# Patient Record
Sex: Male | Born: 1995 | Race: White | Hispanic: No | Marital: Single | Smoking: Never smoker
Health system: Southern US, Community
[De-identification: ages and names within clinical notes are randomized; demographics above are authoritative.]

## PROBLEM LIST (undated history)

## (undated) HISTORY — PX: WISDOM TOOTH EXTRACTION: SHX21

---
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
## 2014-08-14 ENCOUNTER — Emergency Department (HOSPITAL_COMMUNITY)
Admission: EM | Admit: 2014-08-14 | Discharge: 2014-08-15 | Disposition: A | Discharge status: Home / Self Care | Payer: Managed Care, Other (non HMO) | Attending: Emergency Medicine | Admitting: Emergency Medicine

## 2014-08-14 ENCOUNTER — Encounter (HOSPITAL_COMMUNITY): Payer: Self-pay | Admitting: Emergency Medicine

## 2014-08-14 DIAGNOSIS — R109 Unspecified abdominal pain: Secondary | ICD-10-CM | POA: Diagnosis present

## 2014-08-14 DIAGNOSIS — R10813 Right lower quadrant abdominal tenderness: Secondary | ICD-10-CM | POA: Insufficient documentation

## 2014-08-14 DIAGNOSIS — Z79899 Other long term (current) drug therapy: Secondary | ICD-10-CM | POA: Insufficient documentation

## 2014-08-14 DIAGNOSIS — K5900 Constipation, unspecified: Secondary | ICD-10-CM | POA: Diagnosis not present

## 2014-08-14 DIAGNOSIS — R10811 Right upper quadrant abdominal tenderness: Secondary | ICD-10-CM | POA: Diagnosis not present

## 2014-08-14 LAB — CBC WITH DIFFERENTIAL/PLATELET
BASOS ABS: 0 10*3/uL (ref 0.0–0.1)
Basophils Relative: 0 % (ref 0–1)
EOS PCT: 5 % (ref 0–5)
Eosinophils Absolute: 0.5 10*3/uL (ref 0.0–0.7)
HEMATOCRIT: 48.8 % (ref 39.0–52.0)
Hemoglobin: 17.1 g/dL — ABNORMAL HIGH (ref 13.0–17.0)
LYMPHS PCT: 45 % (ref 12–46)
Lymphs Abs: 4.4 10*3/uL — ABNORMAL HIGH (ref 0.7–4.0)
MCH: 31.6 pg (ref 26.0–34.0)
MCHC: 35 g/dL (ref 30.0–36.0)
MCV: 90.2 fL (ref 78.0–100.0)
Monocytes Absolute: 0.8 10*3/uL (ref 0.1–1.0)
Monocytes Relative: 8 % (ref 3–12)
NEUTROS ABS: 4.1 10*3/uL (ref 1.7–7.7)
Neutrophils Relative %: 42 % — ABNORMAL LOW (ref 43–77)
PLATELETS: 195 10*3/uL (ref 150–400)
RBC: 5.41 MIL/uL (ref 4.22–5.81)
RDW: 12.1 % (ref 11.5–15.5)
WBC: 9.8 10*3/uL (ref 4.0–10.5)

## 2014-08-14 LAB — LIPASE, BLOOD: LIPASE: 26 U/L (ref 11–59)

## 2014-08-14 LAB — COMPREHENSIVE METABOLIC PANEL
ALT: 16 U/L (ref 0–53)
AST: 24 U/L (ref 0–37)
Albumin: 4.3 g/dL (ref 3.5–5.2)
Alkaline Phosphatase: 59 U/L (ref 39–117)
Anion gap: 16 — ABNORMAL HIGH (ref 5–15)
BUN: 18 mg/dL (ref 6–23)
CO2: 23 meq/L (ref 19–32)
Calcium: 9.7 mg/dL (ref 8.4–10.5)
Chloride: 101 mEq/L (ref 96–112)
Creatinine, Ser: 1.26 mg/dL (ref 0.50–1.35)
GFR calc Af Amer: >90 mL/min (ref >90)
GFR calc non Af Amer: 81 mL/min — ABNORMAL LOW (ref >90)
Glucose, Bld: 99 mg/dL (ref 70–99)
Potassium: 3.3 mEq/L — ABNORMAL LOW (ref 3.7–5.3)
SODIUM: 140 meq/L (ref 137–147)
Total Bilirubin: 0.4 mg/dL (ref 0.3–1.2)
Total Protein: 6.9 g/dL (ref 6.0–8.3)

## 2014-08-14 MED ORDER — MORPHINE SULFATE 4 MG/ML IJ SOLN
4.0000 mg | Freq: Once | INTRAMUSCULAR | Status: AC
  Administered 2014-08-14: 4 mg via INTRAVENOUS
  Filled 2014-08-14: qty 1

## 2014-08-14 MED ORDER — IOHEXOL 300 MG/ML  SOLN
25.0000 mL | Freq: Once | INTRAMUSCULAR | Status: AC | PRN
  Administered 2014-08-14: 25 mL via ORAL

## 2014-08-14 MED ORDER — FENTANYL CITRATE 0.05 MG/ML IJ SOLN
50.0000 ug | Freq: Once | INTRAMUSCULAR | Status: AC
  Administered 2014-08-14: 50 ug via INTRAVENOUS
  Filled 2014-08-14: qty 2

## 2014-08-14 NOTE — ED Provider Notes (Signed)
CSN: [id number]     Arrival date & time 08/14/14  2022 History   First MD Initiated Contact with Patient 08/14/14 2304     Chief Complaint  Patient presents with  . Abdominal Pain     (Consider location/radiation/quality/duration/timing/severity/associated sxs/prior Treatment) HPI  patient presents with one week of intermittent abdominal pain. Describes the pain as cramping. Mostly affects the right side of the abdomen. No associated nausea, vomiting or diarrhea. No constipation. No fever or chills. Pain is worse after eating. Denies any melena or gross blood in stool. No urinary symptoms. No previous abdominal surgeries. Patient given pain medication in triage with some improvement of pain. History reviewed. No pertinent past medical history. History reviewed. No pertinent past surgical history. No family history on file. History  Substance Use Topics  . Smoking status: Never Smoker   . Smokeless tobacco: Not on file  . Alcohol Use: No    Review of Systems  Constitutional: Negative for fever and chills.  Respiratory: Negative for shortness of breath.   Cardiovascular: Negative for chest pain.  Gastrointestinal: Positive for abdominal pain. Negative for nausea, vomiting, diarrhea and constipation.  Genitourinary: Negative for dysuria, frequency and hematuria.  Musculoskeletal: Negative for myalgias, back pain, neck pain and neck stiffness.  Skin: Negative for rash and wound.  Neurological: Negative for dizziness, weakness, light-headedness, numbness and headaches.  All other systems reviewed and are negative.     Allergies  Review of patient's allergies indicates no known allergies.  Home Medications   Prior to Admission medications   Medication Sig Start Date End Date Taking? Authorizing Provider  dicyclomine (BENTYL) 20 MG tablet Take 1 tablet (20 mg total) by mouth 2 (two) times daily as needed for spasms. 08/15/14   Loren Raceravid Arrow Emmerich, MD  docusate sodium (COLACE) 100 MG  capsule Take 1 capsule (100 mg total) by mouth every 12 (twelve) hours. 08/15/14   Loren Raceravid Ricahrd Schwager, MD  polyethylene glycol [facility](MIRALAX / Ethelene HalGLYCOLAX) packet Take 17 g by mouth daily. 08/15/14   Loren Raceravid Ganesh Deeg, MD   BP 117/50 mmHg  Pulse 71  Temp(Src) 97.8 F (36.6 C) (Oral)  Resp 24  Ht 6\' 1"  (1.854 m)  Wt 204 lb (92.534 kg)  BMI 26.92 kg/m2  SpO2 98% Physical Exam  Constitutional: He is oriented to person, place, and time. He appears well-developed and well-nourished. No distress.  HENT:  Head: Normocephalic and atraumatic.  Mouth/Throat: Oropharynx is clear and moist.  Eyes: EOM are normal. Pupils are equal, round, and reactive to light.  Neck: Normal range of motion. Neck supple.  Cardiovascular: Normal rate and regular rhythm.   Pulmonary/Chest: Effort normal and breath sounds normal. No respiratory distress. He has no wheezes. He has no rales.  Abdominal: Soft. Bowel sounds are normal. He exhibits no distension and no mass. There is tenderness (tenderness to palpation in the right upper and lower quadrants.no rebound or guarding.). There is no rebound and no guarding.  Musculoskeletal: Normal range of motion. He exhibits no edema or tenderness.  No CVA tenderness bilaterally.  Neurological: He is alert and oriented to person, place, and time.  Skin: Skin is warm and dry. No rash noted. No erythema.  Psychiatric: He has a normal mood and affect. His behavior is normal.  Nursing note and vitals reviewed.   ED Course  Procedures (including critical care time) Labs Review Labs Reviewed  CBC WITH DIFFERENTIAL - Abnormal; Notable for the following:    Hemoglobin 17.1 (*)    Neutrophils Relative % 42 (*)  Lymphs Abs 4.4 (*)    All other components within normal limits  COMPREHENSIVE METABOLIC PANEL - Abnormal; Notable for the following:    Potassium 3.3 (*)    GFR calc non Af Amer 81 (*)    Anion gap 16 (*)    All other components within normal limits  URINALYSIS, ROUTINE W  REFLEX MICROSCOPIC - Abnormal; Notable for the following:    Leukocytes, UA TRACE (*)    All other components within normal limits  LIPASE, BLOOD  URINE MICROSCOPIC-ADD ON    Imaging Review Ct Abdomen Pelvis W Contrast  08/15/2014   CLINICAL DATA:  Mid abdominal pain and cramping, dizziness and lightheadedness for 3 days. No fevers.  EXAM: CT ABDOMEN AND PELVIS WITH CONTRAST  TECHNIQUE: Multidetector CT imaging of the abdomen and pelvis was performed using the standard protocol following bolus administration of intravenous contrast.  CONTRAST:  100mL OMNIPAQUE IOHEXOL 300 MG/ML  SOLN  COMPARISON:  None.  FINDINGS: LUNG BASES: Included view of the lung bases are clear. Visualized heart and pericardium are unremarkable.  SOLID ORGANS: The liver, spleen, gallbladder, pancreas and adrenal glands are unremarkable.  GASTROINTESTINAL TRACT: The stomach, small and large bowel are normal in course and caliber without inflammatory changes. Moderate amount of retained large bowel stool. Small amount of small bowel feces suggests chronic stasis. Normal appendix.  KIDNEYS/ URINARY TRACT: Kidneys are orthotopic, demonstrating symmetric enhancement. No nephrolithiasis, hydronephrosis or solid renal masses. The unopacified ureters are normal in course and caliber. Urinary bladder is partially distended and unremarkable.  PERITONEUM/RETROPERITONEUM: No intraperitoneal free fluid nor free air. Aortoiliac vessels are normal in course and caliber. No lymphadenopathy by CT size criteria. Internal reproductive organs are unremarkable.  SOFT TISSUE/OSSEOUS STRUCTURES: Nonsuspicious. 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 Small fat containing umbilical hernia.  IMPRESSION: Moderate amount of retained large bowel stool without obstruction. No acute intra-abdominal pelvic process.   Electronically Signed   By: Awilda Metroourtnay  Bloomer   On: 08/15/2014 00:51     EKG Interpretation None       MDM   Final diagnoses:  Right sided abdominal pain  Constipation, unspecified constipation type   Labs within normal limits. CT demonstrates retained stool in colon. This is consistent with the patient's history and exam. No surgical cause for the patient's symptomatology identified.  Abdominal exam remains benign. Return precautions given. Advised to follow gastroenterology should symptoms continue.     Loren Raceravid Assyria Morreale, MD 08/15/14 [phone]

## 2014-08-14 NOTE — ED Notes (Signed)
Pt. reports mid abdominal pain / cramping  , dizziness and lightheaded onset 3 days ago , no fever or chills.

## 2014-08-14 NOTE — ED Notes (Signed)
Informed pt that we need a urine sample. Pt unable to go at this time.  

## 2014-08-15 ENCOUNTER — Emergency Department (HOSPITAL_COMMUNITY): Payer: Managed Care, Other (non HMO)

## 2014-08-15 ENCOUNTER — Encounter (HOSPITAL_COMMUNITY): Payer: Self-pay | Admitting: Radiology

## 2014-08-15 LAB — URINE MICROSCOPIC-ADD ON

## 2014-08-15 LAB — URINALYSIS, ROUTINE W REFLEX MICROSCOPIC
Bilirubin Urine: NEGATIVE
GLUCOSE, UA: NEGATIVE mg/dL
Hgb urine dipstick: NEGATIVE
KETONES UR: NEGATIVE mg/dL
Nitrite: NEGATIVE
PH: 6 (ref 5.0–8.0)
Protein, ur: NEGATIVE mg/dL
SPECIFIC GRAVITY, URINE: 1.025 (ref 1.005–1.030)
Urobilinogen, UA: 0.2 mg/dL (ref 0.0–1.0)

## 2014-08-15 MED ORDER — POLYETHYLENE GLYCOL 3350 17 G PO PACK: 17.0000 g | PACK | Freq: Every day | ORAL | Status: DC

## 2014-08-15 MED ORDER — DICYCLOMINE HCL 20 MG PO TABS: 20.0000 mg | ORAL_TABLET | Freq: Two times a day (BID) | ORAL | Status: DC | PRN

## 2014-08-15 MED ORDER — DOCUSATE SODIUM 100 MG PO CAPS: 100.0000 mg | ORAL_CAPSULE | Freq: Two times a day (BID) | ORAL | Status: DC

## 2014-08-15 MED ORDER — IOHEXOL 300 MG/ML  SOLN
100.0000 mL | Freq: Once | INTRAMUSCULAR | Status: AC | PRN
  Administered 2014-08-15: 100 mL via INTRAVENOUS

## 2014-08-15 NOTE — Discharge Instructions (Signed)
Abdominal Pain °Many things can cause abdominal pain. Usually, abdominal pain is not caused by a disease and will improve without treatment. It can often be observed and treated at home. Your health care provider will do a physical exam and possibly order blood tests and X-rays to help determine the seriousness of your pain. However, in many cases, more time must pass before a clear cause of the pain can be found. Before that point, your health care provider may not know if you need more testing or further treatment. °HOME CARE INSTRUCTIONS  °Monitor your abdominal pain for any changes. The following actions may help to alleviate any discomfort you are experiencing: °· Only take over-the-counter or prescription medicines as directed by your health care provider. °· Do not take laxatives unless directed to do so by your health care provider. °· Try a clear liquid diet (broth, tea, or water) as directed by your health care provider. Slowly move to a bland diet as tolerated. °SEEK MEDICAL CARE IF: °· You have unexplained abdominal pain. °· You have abdominal pain associated with nausea or diarrhea. °· You have pain when you urinate or have a bowel movement. °· You experience abdominal pain that wakes you in the night. °· You have abdominal pain that is worsened or improved by eating food. °· You have abdominal pain that is worsened with eating fatty foods. °· You have a fever. °SEEK IMMEDIATE MEDICAL CARE IF:  °· Your pain does not go away within 2 hours. °· You keep throwing up (vomiting). °· Your pain is felt only in portions of the abdomen, such as the right side or the left lower portion of the abdomen. °· You pass bloody or black tarry stools. °MAKE SURE YOU: °· Understand these instructions.   °· Will watch your condition.   °· Will get help right away if you are not doing well or get worse.   °Document Released: 06/28/2005 Document Revised: 09/23/2013 Document Reviewed: 05/28/2013 °ExitCare® Patient Information  ©2015 [employer]. This information is not intended to replace advice given to you by your health care provider. Make sure you discuss any questions you have with your health care provider. ° °Constipation °Constipation is when a person has fewer than three bowel movements a week, has difficulty having a bowel movement, or has stools that are dry, hard, or larger than normal. As people grow older, constipation is more common. If you try to fix constipation with medicines that make you have a bowel movement (laxatives), the problem may get worse. Long-term laxative use may cause the muscles of the colon to become weak. A low-fiber diet, not taking in enough fluids, and taking certain medicines may make constipation worse.  °CAUSES  °· Certain medicines, such as antidepressants, pain medicine, iron supplements, antacids, and water pills.   °· Certain diseases, such as diabetes, irritable bowel syndrome (IBS), thyroid disease, or depression.   °· Not drinking enough water.   °· Not eating enough fiber-rich foods.   °· Stress or travel.   °· Lack of physical activity or exercise.   °· Ignoring the urge to have a bowel movement.   °· Using laxatives too much.   °SIGNS AND SYMPTOMS  °· Having fewer than three bowel movements a week.   °· Straining to have a bowel movement.   °· Having stools that are hard, dry, or larger than normal.   °· Feeling full or bloated.   °· Pain in the lower abdomen.   °· Not feeling relief after having a bowel movement.   °DIAGNOSIS  °Your health care provider will take   a medical history and perform a physical exam. Further testing may be done for severe constipation. Some tests may include: °· A barium enema X-ray to examine your rectum, colon, and, sometimes, your small intestine.   °· A sigmoidoscopy to examine your lower colon.   °· A colonoscopy to examine your entire colon. °TREATMENT  °Treatment will depend on the severity of your constipation and what is causing it. Some dietary  treatments include drinking more fluids and eating more fiber-rich foods. Lifestyle treatments may include regular exercise. If these diet and lifestyle recommendations do not help, your health care provider may recommend taking over-the-counter laxative medicines to help you have bowel movements. Prescription medicines may be prescribed if over-the-counter medicines do not work.  °HOME CARE INSTRUCTIONS  °· Eat foods that have a lot of fiber, such as fruits, vegetables, whole grains, and beans. °· Limit foods high in fat and processed sugars, such as french fries, hamburgers, cookies, candies, and soda.   °· A fiber supplement may be added to your diet if you cannot get enough fiber from foods.   °· Drink enough fluids to keep your urine clear or pale yellow.   °· Exercise regularly or as directed by your health care provider.   °· Go to the restroom when you have the urge to go. Do not hold it.   °· Only take over-the-counter or prescription medicines as directed by your health care provider. Do not take other medicines for constipation without talking to your health care provider first.   °SEEK IMMEDIATE MEDICAL CARE IF:  °· You have bright red blood in your stool.   °· Your constipation lasts for more than 4 days or gets worse.   °· You have abdominal or rectal pain.   °· You have thin, pencil-like stools.   °· You have unexplained weight loss. °MAKE SURE YOU:  °· Understand these instructions. °· Will watch your condition. °· Will get help right away if you are not doing well or get worse. °Document Released: 06/16/2004 Document Revised: 09/23/2013 Document Reviewed: 06/30/2013 °ExitCare® Patient Information ©2015 [employer]. This information is not intended to replace advice given to you by your health care provider. Make sure you discuss any questions you have with your health care provider. ° °

## 2014-08-24 ENCOUNTER — Telehealth (HOSPITAL_BASED_OUTPATIENT_CLINIC_OR_DEPARTMENT_OTHER): Payer: Self-pay

## 2014-08-26 ENCOUNTER — Ambulatory Visit: Payer: Self-pay | Admitting: Urgent Care

## 2014-08-26 LAB — CBC WITH DIFFERENTIAL/PLATELET
Basophil #: 0 10*3/uL (ref 0.0–0.1)
Basophil %: 0.6 %
EOS ABS: 1.2 10*3/uL — AB (ref 0.0–0.7)
EOS PCT: 18.5 %
HCT: 48.9 % (ref 40.0–52.0)
HGB: 16.5 g/dL (ref 13.0–18.0)
Lymphocyte #: 2.6 10*3/uL (ref 1.0–3.6)
Lymphocyte %: 38.9 %
MCH: 31.2 pg (ref 26.0–34.0)
MCHC: 33.8 g/dL (ref 32.0–36.0)
MCV: 92 fL (ref 80–100)
Monocyte #: 0.4 x10 3/mm (ref 0.2–1.0)
Monocyte %: 6.4 %
NEUTROS ABS: 2.4 10*3/uL (ref 1.4–6.5)
Neutrophil %: 35.6 %
Platelet: 184 10*3/uL (ref 150–440)
RBC: 5.29 10*6/uL (ref 4.40–5.90)
RDW: 12.4 % (ref 11.5–14.5)
WBC: 6.7 10*3/uL (ref 3.8–10.6)

## 2014-08-28 ENCOUNTER — Ambulatory Visit: Payer: Self-pay | Admitting: Urgent Care

## 2014-08-28 LAB — CBC WITH DIFFERENTIAL/PLATELET
Basophil #: 0 10*3/uL (ref 0.0–0.1)
Basophil %: 0.5 %
EOS ABS: 0.9 10*3/uL — AB (ref 0.0–0.7)
EOS PCT: 14 %
HCT: 51.6 % (ref 40.0–52.0)
HGB: 17.4 g/dL (ref 13.0–18.0)
Lymphocyte #: 2.5 10*3/uL (ref 1.0–3.6)
Lymphocyte %: 40.9 %
MCH: 31.5 pg (ref 26.0–34.0)
MCHC: 33.6 g/dL (ref 32.0–36.0)
MCV: 94 fL (ref 80–100)
MONO ABS: 0.4 x10 3/mm (ref 0.2–1.0)
Monocyte %: 6.6 %
Neutrophil #: 2.3 10*3/uL (ref 1.4–6.5)
Neutrophil %: 38 %
Platelet: 193 10*3/uL (ref 150–440)
RBC: 5.51 10*6/uL (ref 4.40–5.90)
RDW: 12.6 % (ref 11.5–14.5)
WBC: 6.1 10*3/uL (ref 3.8–10.6)

## 2014-08-28 LAB — TSH: Thyroid Stimulating Horm: 1.51 u[IU]/mL

## 2014-08-28 LAB — CLOSTRIDIUM DIFFICILE(ARMC)

## 2014-08-30 LAB — WBCS, STOOL

## 2014-08-30 LAB — STOOL CULTURE

## 2014-08-31 ENCOUNTER — Ambulatory Visit: Payer: Self-pay | Admitting: Urgent Care

## 2014-08-31 LAB — URINALYSIS, COMPLETE
Bacteria: NONE SEEN
Bilirubin,UR: NEGATIVE
Blood: NEGATIVE
GLUCOSE, UR: NEGATIVE mg/dL (ref 0–75)
Ketone: NEGATIVE
Leukocyte Esterase: NEGATIVE
NITRITE: NEGATIVE
PROTEIN: NEGATIVE
Ph: 7 (ref 4.5–8.0)
RBC, UR: NONE SEEN /HPF (ref 0–5)
SPECIFIC GRAVITY: 1.013 (ref 1.003–1.030)
Squamous Epithelial: NONE SEEN
WBC UR: <1 /HPF (ref 0–5)

## 2014-09-02 LAB — URINE CULTURE

## 2014-10-06 ENCOUNTER — Inpatient Hospital Stay: Payer: Self-pay | Admitting: Internal Medicine

## 2014-10-06 LAB — URINALYSIS, COMPLETE
BACTERIA: NONE SEEN
Bilirubin,UR: NEGATIVE
Blood: NEGATIVE
Glucose,UR: NEGATIVE mg/dL (ref 0–75)
Ketone: NEGATIVE
Leukocyte Esterase: NEGATIVE
Nitrite: NEGATIVE
PROTEIN: NEGATIVE
Ph: 5 (ref 4.5–8.0)
RBC,UR: <1 /HPF (ref 0–5)
SPECIFIC GRAVITY: 1.035 (ref 1.003–1.030)
Squamous Epithelial: NONE SEEN
WBC UR: 1 /HPF (ref 0–5)

## 2014-10-06 LAB — CBC WITH DIFFERENTIAL/PLATELET
Basophil #: 0 10*3/uL (ref 0.0–0.1)
Basophil %: 0.1 %
Eosinophil #: 0.1 10*3/uL (ref 0.0–0.7)
Eosinophil %: 0.5 %
HCT: 51.1 % (ref 40.0–52.0)
HGB: 17.4 g/dL (ref 13.0–18.0)
LYMPHS PCT: 17.2 %
Lymphocyte #: 2 10*3/uL (ref 1.0–3.6)
MCH: 30.9 pg (ref 26.0–34.0)
MCHC: 33.9 g/dL (ref 32.0–36.0)
MCV: 91 fL (ref 80–100)
Monocyte #: 0.5 x10 3/mm (ref 0.2–1.0)
Monocyte %: 4.5 %
NEUTROS ABS: 9.2 10*3/uL — AB (ref 1.4–6.5)
Neutrophil %: 77.7 %
Platelet: 219 10*3/uL (ref 150–440)
RBC: 5.62 10*6/uL (ref 4.40–5.90)
RDW: 13 % (ref 11.5–14.5)
WBC: 11.9 10*3/uL — ABNORMAL HIGH (ref 3.8–10.6)

## 2014-10-06 LAB — COMPREHENSIVE METABOLIC PANEL
Albumin: 4.5 g/dL (ref 3.4–5.0)
Alkaline Phosphatase: 80 U/L
Anion Gap: 8 (ref 7–16)
BUN: 18 mg/dL (ref 7–18)
Bilirubin,Total: 0.6 mg/dL (ref 0.2–1.0)
CALCIUM: 9.5 mg/dL (ref 8.5–10.1)
CO2: 24 mmol/L (ref 21–32)
Chloride: 104 mmol/L (ref 98–107)
Creatinine: 1.19 mg/dL (ref 0.60–1.30)
EGFR (African American): >60
EGFR (Non-African Amer.): >60
Glucose: 118 mg/dL — ABNORMAL HIGH (ref 65–99)
Osmolality: 275 (ref 275–301)
POTASSIUM: 3.8 mmol/L (ref 3.5–5.1)
SGOT(AST): 18 U/L (ref 15–37)
SGPT (ALT): 24 U/L
Sodium: 136 mmol/L (ref 136–145)
Total Protein: 7.9 g/dL (ref 6.4–8.2)

## 2014-10-06 LAB — LIPASE, BLOOD: LIPASE: 101 U/L (ref 73–393)

## 2014-10-08 LAB — BASIC METABOLIC PANEL
Anion Gap: 7 (ref 7–16)
BUN: 7 mg/dL (ref 7–18)
CHLORIDE: 106 mmol/L (ref 98–107)
CO2: 27 mmol/L (ref 21–32)
Calcium, Total: 8.5 mg/dL (ref 8.5–10.1)
Creatinine: 1.17 mg/dL (ref 0.60–1.30)
EGFR (African American): >60
EGFR (Non-African Amer.): >60
Glucose: 85 mg/dL (ref 65–99)
Osmolality: 277 (ref 275–301)
Potassium: 3.6 mmol/L (ref 3.5–5.1)
Sodium: 140 mmol/L (ref 136–145)

## 2014-10-08 LAB — TSH: Thyroid Stimulating Horm: 1.87 u[IU]/mL

## 2014-11-26 ENCOUNTER — Emergency Department: Payer: Self-pay | Admitting: Emergency Medicine

## 2015-01-31 NOTE — Consult Note (Signed)
After discussions in his family the patient has decided to stay here and have a colonoscopy tomorrow afternoon after a split prep. The procedure will be done late tomorrow afternoon.  Electronic Signatures: Scot JunElliott, Asher Torpey T (MD)  (Signed on 06-Jan-16 18:51)  Authored  Last Updated: 06-Jan-16 18:51 by Scot JunElliott, Taseen Marasigan T (MD)

## 2015-01-31 NOTE — Discharge Summary (Signed)
PATIENT NAME:  Oscar Garcia, Oscar Garcia MR#:  [id number] DATE OF BIRTH:  23-Sep-1996  DATE OF ADMISSION:  10/06/2014 DATE OF DISCHARGE:  10/08/2014  CONSULTANT: Lynnae Prudeobert Elliott, MD  ADMITTING DIAGNOSIS: Abdominal pain.   DISCHARGE DIAGNOSES: 1.  Abdominal pain due to ileus as well as constipation, now improved after bowel movement, status post colonoscopy without any significant abnormality.  2.  Intermittent constipation. The patient started on a bowel regimen.   PERTINENT DIAGNOSTIC DATA: Admitting glucose 118, BUN 18, creatinine 1.19, sodium 136, potassium 3.8, chloride 104, CO2 24, calcium 9.5. Lipase 101. LFTs are normal. TSH 1.87. WBC 11.9, hemoglobin 17.4, platelet count 219,000. Urinalysis was negative.   CT scan of the abdomen and pelvis with contrast showed abnormal mildly distended small bowel loops with some air-fluid level. Findings consistent with at least partial small bowel obstruction or ileus. There is segmental distention of the terminal ileum with fecal-like material. There is no pericecal inflammation. Appendix is not visualized.   Colonoscopy was negative.   HOSPITAL COURSE: Please refer to H and Garcia done by the admitting physician. The patient is a 19 year old who has recently had issues with his abdomen and has been seen in the Emergency Room and has been also followed up as an outpatient who presented with complaint of abdominal pain. The patient had a CT scan which suggested a possible ileus. Due to that we were asked to admit the patient. He was given a bowel prep with resolution of his abdominal pain and had a good bowel movement. The patient was seen by GI and then had a colonoscopy, which was negative. At this time, he is doing much better, stable for discharge.   DISCHARGE MEDICATIONS: Linzess 290 mcg daily, MiraLax 17 grams daily, docusate senna 1 tab Garcia.o. q. 12.   DISCHARGE DIET: Regular.   ACTIVITY: As tolerated.   DISCHARGE FOLLOWUP: Follow up with primary GI in 1 to 2  weeks.   TIME SPENT: 35 minutes.  ____________________________ Lacie ScottsShreyang H. Allena KatzPatel, MD shp:sb D: 10/09/2014 13:31:21 ET T: 10/09/2014 14:03:56 ET JOB#: [id number]  cc: Besan Ketchem H. Allena KatzPatel, MD, <Dictator> Charise CarwinSHREYANG H Mozetta Murfin MD ELECTRONICALLY SIGNED 10/10/2014 9:26

## 2015-01-31 NOTE — Consult Note (Signed)
Pt had completely normal colonoscopy with normal terminal ileum.  He can go home and take meds for constipation and follow up with us in a month or so. Recommended docusate with senna.  Electronic Signatures: Scot JunElliott, Robert T (MD)  (Signed on 07-Jan-16 17:37)  Authored  Last Updated: 07-Jan-16 17:37 by Scot JunElliott, Robert T (MD)

## 2015-01-31 NOTE — H&P (Signed)
PATIENT NAME:  Oscar Garcia, Oscar Garcia MR#:  [id number] DATE OF BIRTH:  Jun 16, 1996  DATE OF ADMISSION:  10/06/2014  PRIMARY CARE PHYSICIAN:  None.   GASTROENTEROLOGY: Dr. Servando Garcia, Oscar Garcia, nurse practitioner.  CHIEF COMPLAINT: Abdominal pain since 2:00 a.m. this morning.  HISTORY OF PRESENT ILLNESS: Oscar Garcia is a 19 year old student at [employer], who has no past medical history, comes into the Emergency Room accompanied by his parents with the above chief complaint. Most of the history is given by the patient's mother, since the patient currently is trying to rest since he received Dilaudid and morphine earlier for his abdominal pain. Per mother, the patient has been having on and off abdominal pain since mid-November. He was evaluated with 2 CT scans, [facility]; however, continued to have abdominal pain. Was seen by Oscar Garcia, nurse practitioner with Dr. Servando Garcia, in end of November. KUB on November 25th was done, which showed evidence of constipation. The patient was started on some stool nerves, continued to have on and off abdominal pain with, most likely, hard stools and small amount of stools, as per patient. He was seen again and a CAT scan was done with contrast on November 30th, which was essentially negative,  and serum IgA were essentially negative. Given the patient's continued symptoms of constipation, the patient was started on Linzess per GI. He took it for about a week, started feeling better and thereafter, he stopped taking it. He started having abdominal pain this morning around 2:00, along with an episode of vomiting. He came to the Emergency Room, received a few rounds of IV fentanyl, Dilaudid, and morphine to settle his pain down. CT scan of the abdomen and pelvis done with contrast shows small bowel ileus, along with component of some dilatation in the cecal area, with significant amount of stool distributed throughout  the large intestine. His last meal was yesterday dinner, and he had a bowel movement this morning, which was a small amount. His mother gave him a 64-ounce of Gatorade with MiraLax  this morning without any result. The patient is being admitted for possible ileus/partial obstruction.   In the Emergency Room, surgery was contacted. Dr. Egbert Garcia recommends no acute surgical indication and internal medicine, hence, was consulted for admission.   PAST MEDICAL HISTORY: None, other than new onset of abdominal pain, likely secondary to constipation.   MEDICATIONS: Linzess 290 mcg capsule once a day, which the patient took it for about a week and has stopped it for the last 10 days. This was prescribed by GI, Dr. Servando Garcia.   ALLERGIES: No known drug allergies.   SOCIAL HISTORY: Not a smoker. Goes to school at [employer]. Denies any other drug use.   FAMILY HISTORY: Negative for heart disease.   REVIEW OF SYSTEMS:  CONSTITUTIONAL: No fever, fatigue, weakness.  EYES: No blurred or double vision, glaucoma, or cataracts.  EARS, NOSE, AND THROAT: No tinnitus, ear pain, hearing loss.  RESPIRATORY: No cough, wheeze, hemoptysis.  CARDIOVASCULAR: No chest pain, orthopnea, edema, or high blood pressure.  GASTROINTESTINAL: Positive for nausea, vomiting, and abdominal pain, along with constipation.  GENITOURINARY: No dysuria, hematuria, or frequency.  ENDOCRINE: No polyuria, nocturia, or thyroid problems.  HEMATOLOGY: No anemia or easy bruising.  SKIN: No acne, rash, or lesion.  MUSCULOSKELETAL: No arthritis, swelling, or gout.  NEUROLOGIC: No CVA, TIA, or ataxia,  PSYCHIATRIC: No anxiety, depression, or bipolar disorder. All other systems reviewed and negative.  PHYSICAL EXAMINATION: GENERAL: The patient is awake, a bit more sleepy, answered all questions appropriately.  VITAL SIGNS: Temperature is afebrile. Pulse is 61, blood pressure 118/58. Sats are 96% on room air.  HEENT: Atraumatic, normocephalic. Pupils  PERRLA, EOM intact. Oral mucosa is moist.  NECK: Supple. No JVD. No carotid bruits.  RESPIRATORY: Clear to auscultation bilaterally. No rales, rhonchi, respiratory distress, or labored breathing.  CARDIOVASCULAR: Both the heart sounds are normal. Rate and rhythm is regular. PMI not lateralized.  CHEST: Nontender.  EXTREMITIES: Good pedal pulses, good femoral pulses. No lower extremity edema.  ABDOMEN: Soft, benign. There is some diffuse tenderness on palpation. No guarding, rigidity. Hypoactive bowel sounds present. No abdominal mass noted.  NEUROLOGICAL: Grossly intact cranial nerves II through XII. No motor or sensory deficits.  PSYCHIATRIC: The patient is awake, alert, oriented x 3.   LABORATORY DATA: Urinalysis: Negative for UTI. CT of the abdomen shows abnormal, mild distended small bowel loops with some air fluid levels. No contrast material is noted in the distal small bowel or within the right colon. Consistent with at least partial small bowel obstruction or significant IBS. There is subsegmental distention of the terminal ileum with fecal-like material, although ileocecal valve is open. I cannot exclude mild fecal impaction at this level, a small amount of fluid is noted  terminal ileum. There is no pericecal inflammation, appendix is not definite identified. No free abdominal air. No hydroureter or hydronephrosis. White count is 11.9. The rest of the comprehensive metabolic panel within normal limits. CT of the abdomen and pelvis done on November 30th did not show any acute abnormality. The patient had stools studies done on November 26, which were essentially negative. His tissue transglutaminase is IgA is negative. IgA serum is within normal limits, as well.   ASSESSMENT AND PLAN: A 19 year old Oscar Garcia with no significant past medical history comes in with:   1.  Acute abdominal pain, suspected due to significant small bowel ileus in the setting of constipation. The patient has been  having on and off constipation since mid-November. His work-up, including 2 CT scans, which were done at [facility] ER on November 13 and November 30th, done here, have been essentially negative. CT scan from today shows a small bowel obstruction with presence of stool in the large intestine. We will admit the patient to the medical floor. We will continue IV fluids for hydration, give him GoLYTELY for bowel prep to see if ileus resolves, and if the patient has good bowel movements. Surgery was already consulted in the Emergency Room. We will hold off surgical consultation, unless the patient continues to have abdominal pain. We will have surgery and GI on board. Continue stool softener. The patient was also started on Linzess by GI as an outpatient. I will hold off on it and will discuss with GI, if needed to be restarted.  No acute indication for colonoscopy at present. This was discussed with the parents, who voiced understanding.  2.  Deep vein thrombosis prophylaxis. The patient is ambulatory.  3.  Further work-up per regarding the patient's clinical course, hospital admission plan was discussed with the patient and the patient's parents.   TIME SPENT: 50 minutes    ____________________________ Jearl Klinefelter A. Allena Katz, MD sap:mw D: 10/06/2014 10:47:05 ET T: 10/06/2014 11:03:31 ET JOB#: [id number]  cc: Pina Sirianni A. Allena Katz, MD, <Dictator> Joselyn Arrow, NP Midge Minium, MD  Willow Ora MD ELECTRONICALLY SIGNED 10/15/2014 11:18

## 2015-01-31 NOTE — Consult Note (Signed)
PATIENT NAME:  Oscar Garcia, Oscar Garcia MR#:  [id number] DATE OF BIRTH:  10-14-1995  DATE OF CONSULTATION:  10/07/2014  REFERRING PHYSICIAN:  Ilda Basset, MD  CONSULTING PHYSICIAN:  Janalyn Harder. Jerelene Redden, ANP (Adult Nurse Practitioner)  LOCAL GASTROENTEROLOGY: Lucilla Lame, MD, Vickey Huger, FNP (Nurse Practitioner)  REASON FOR CONSULTATION: Abdominal pain.   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 He did develop lower abdominal pain 1 day that was quite severe and isolated to the right lower quadrant. The patient went to the Emergency Room and had a CT study to check the appendix and told he was full of stool. He was referred to a gastroenterologist in [place] who evaluated him in the office and prescribed MiraLAX. The patient was not satisfied with MiraLAX and did continue to have problems with constipation.   He saw Vickey Huger, FNP, about a day after Thanksgiving and reports he had laboratory studies and stool studies that were normal. KUB showed that he was full of stool. He was given MiraLAX bowel prep and started on Linzess.  The patient took Linzess 1 tablet daily for 2 weeks, which caused diarrhea stools 4 to 5 times a day. All of the pain resolved. He then stopped the [place] for 2 weeks. He had small stools every day.   He then developed acute episode of abdominal pain Tuesday night and  presented back to the Emergency Room at [facility]; that shows small bowel ileus along with component of some dilatation in the cecal area with significant stool distributed throughout the large intestine. He was given laxatives during this admission, and his right-sided abdominal pain has once again improved. He is on laxative therapy, passing several stools a day and was able to eat a chicken sandwich today.  GI has been consulted regarding further evaluation and management.   The patient denies fevers or chills, weight loss,  change in appetite or upper GI complaints. He denies blood or melena stool. No prior history of colonoscopy. No family history of colon cancer, colon polyps, or inflammatory bowel disease.   PAST MEDICAL HISTORY: 1.  The patient denied. States he has had always good health.  2.  Recent diagnosis of constipation November 2015.   CURRENT MEDICATIONS: From home Linzess 290 mcg capsules once daily, which he took for about, he tells me 2 weeks and then stopped it for the last 2 weeks.   ALLERGIES: NKDA.   SOCIAL HISTORY: Negative tobacco, alcohol or illicit drug use. He is a Administrator, arts at [employer], studying for occupational therapy and majoring in kinesiology. 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   FAMILY HISTORY: Negative for colon cancer, colon polyps, inflammatory bowel disease. Maternal grandmother with melanoma, diagnosed in her 76s, treated.   REVIEW OF SYSTEMS:  Ten systems reviewed, all negative.   PHYSICAL EXAMINATION: VITAL SIGNS:  Temperature 97.7, pulse 85, respirations 18, blood pressure 111/62, pulse oximetry on room air is 97%.  GENERAL: Well-appearing, healthy looking Caucasian, well-nourished male NAD. He is walking around the room.  HEENT: Shows head is normocephalic. Conjunctivae pink. Sclerae is anicteric. Oral mucosa is moist, intact.  NECK: Supple, trachea midline.  CARDIAC: S1, S2 without murmur, rub or gallop.  PULMONARY: Lungs are CTA.  ABDOMEN: Soft. No significant tenderness noted at this time. Bowel sounds are present. No hepatosplenomegaly.  RECTAL: Deferred.  SKIN: Warm and dry.  EXTREMITIES: Without edema.  NEUROLOGIC: Cranial  nerves II through XII grossly intact, steady on feet.  PSYCHIATRIC: Very pleasant, alert and oriented, normal mood and affect.   LABORATORY STUDIES: Today with unremarkable met b,  liver panel, WBC 11.9, hemoglobin 17.4, platelet count 219,000; neutrophils a little elevated 9.2, urinalysis is  unremarkable.   RADIOLOGY: CT of the abdomen and pelvis with contrast performed 10/06/2014, shows lung bases unremarkable, moderate distended stomach with oral contrast without evidence of gastric outlet obstruction. There are distended small bowel loops with air-fluid levels. Distended fluid filled small bowel loops are noted in the lower abdomen. There is  no contrast material noted within the distal small bowel or within the colon. These findings are highly suspicious for partial small bowel obstruction or significant ileus.   There is segmental distention of the terminal ileum up to 3.5 cm, containing fecal-like material. Ileocecal valve is open, mild fecal impaction at this level cannot be excluded. There is small amount of fluid adjacent to the terminal ileum. There is no pericecal inflammation. The appendix is not definitely identified. No hydronephrosis, hydroureter, no free abdominal air.   Chart review from previous CT of the abdomen and pelvis performed 08/31/2014, showed normal small bowel and colon unremarkable. Appendix is not identified for certain but no findings for acute appendicitis noted. This was a negative study.   IMPRESSION:: The patient presents with right lower quadrant pain, acute constipation, recurrent, started November, which represents a change in bowel habits. CT study showed abnormality with the small bowel to consider ileus. He has had stool in the colon. He has had subsequent laxatives. Abdominal pain resolved, and the last episode of pain was about 1500 yesterday. He tolerated chicken sandwich today. He is tolerating a liquid diet without nausea or vomiting. Abdominal exam was unremarkable. This patient has no significant abdominal history. There is no family history for IBD, colon cancer or colon polyps.   PLAN: This case was discussed with Dr. Vira Agar in collaboration of care. He is in favor of doing a colonoscopy tomorrow afternoon with half the colon prep tonight and  half the colon prep in the morning. I discussed this with the patient's mother. She would prefer Dr. Allen Norris to manage this case because she has confidence in Dr. Allen Norris, and does not know Dr. Vira Agar. I explained that Dr. Vira Agar will be in to talk to patient later this afternoon and that she can make that decision after the conversation with the attending physician. She is comfortable with this plan; further GI recommendations pending.    Thank you for this consultation.   These services were provided by Denice Paradise, ANP.    ____________________________ Janalyn Harder. Jerelene Redden, ANP (Adult Nurse Practitioner) kam:nt D: 10/07/2014 17:21:57 ET T: 10/07/2014 18:04:45 ET JOB#: [id number]  cc: Joelene Millin A. Jerelene Redden, ANP (Adult Nurse Practitioner), <Dictator> Janalyn Harder Sherlyn Hay, MSN, ANP-BC Adult Nurse Practitioner ELECTRONICALLY SIGNED 10/08/2014 8:41

## 2015-11-12 IMAGING — CT CT ABD-PELV W/ CM
2 of 4 series · 16 of 46 positions shown, 18 images · IV contrast (agent unspecified)
Comparison: CT scan 08/15/2014

CLINICAL DATA: Right lower quadrant abdominal pain for 2 weeks.

EXAM:
CT ABDOMEN AND PELVIS WITH CONTRAST
TECHNIQUE: Multidetector CT imaging of the abdomen and pelvis was performed
using the standard protocol following bolus administration of
intravenous contrast.
CONTRAST:  100 cc Osovue-J00

[Series 2: routine abd pel with · axial · 0.66mm/px · z∈[-556,-146]mm · 13 of 90 slices shown, 15 images]
[im 4/90  soft-tissue]
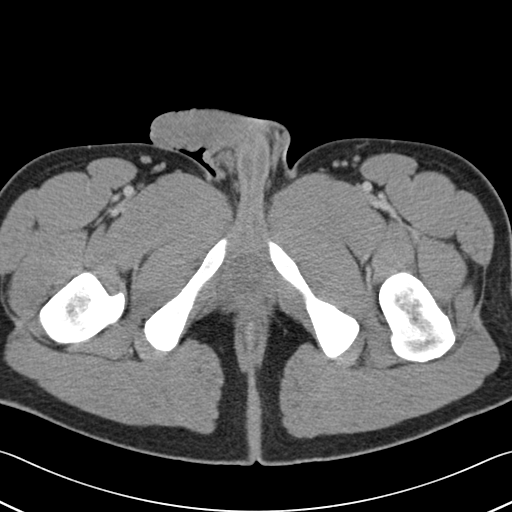
[im 4/90  bone]
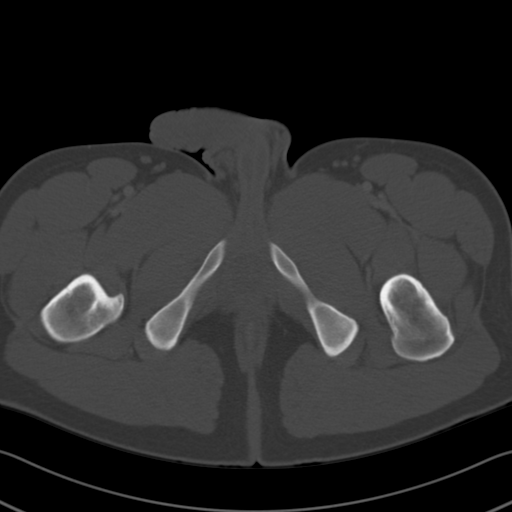
[im 12/90  soft-tissue]
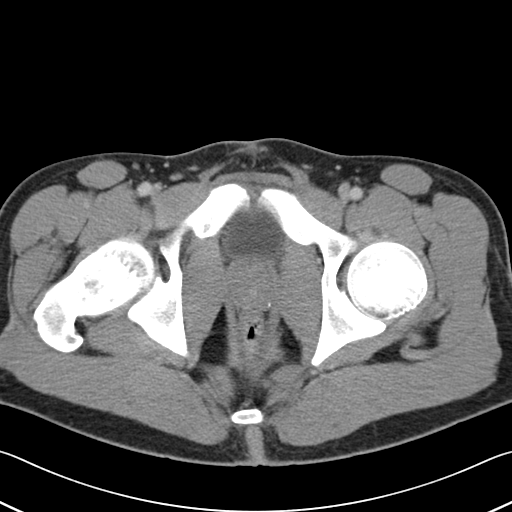
[im 19/90  soft-tissue]
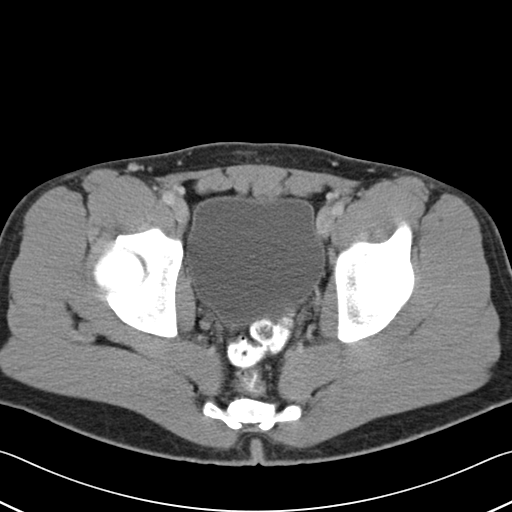
[im 26/90  soft-tissue]
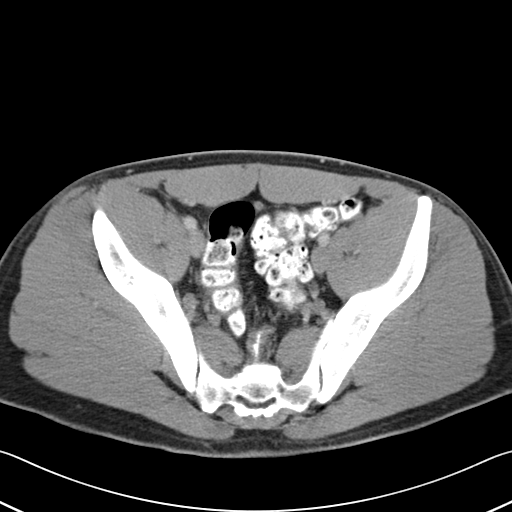
[im 30/90  soft-tissue]
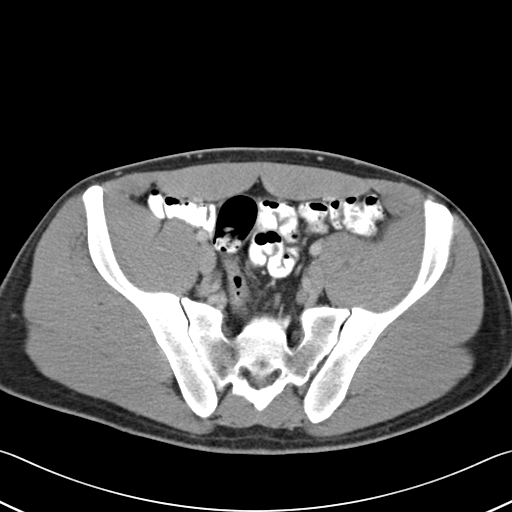
[im 38/90  soft-tissue]
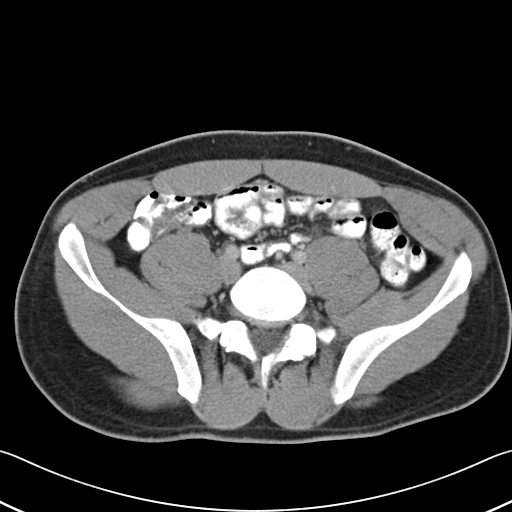
[im 45/90  soft-tissue]
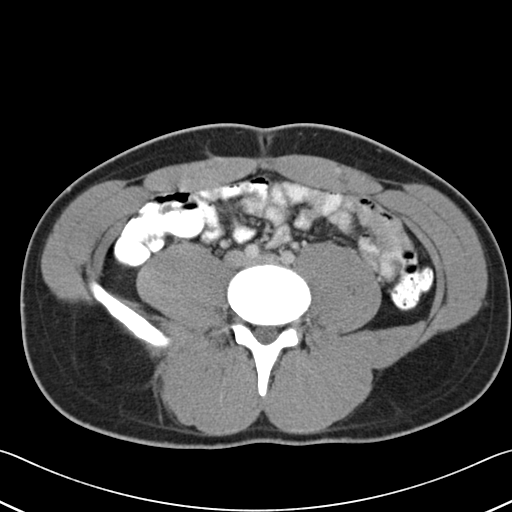
[im 52/90  soft-tissue]
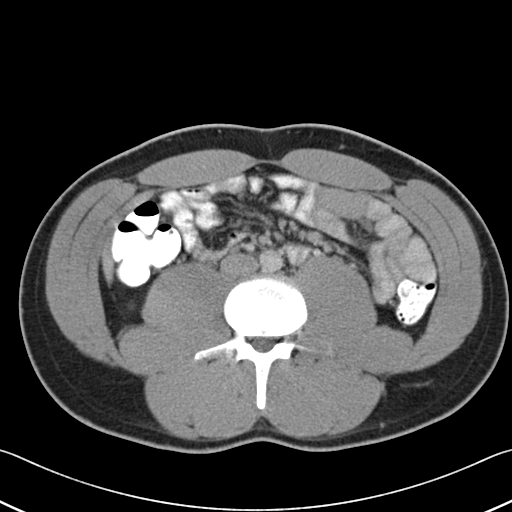
[im 60/90  soft-tissue]
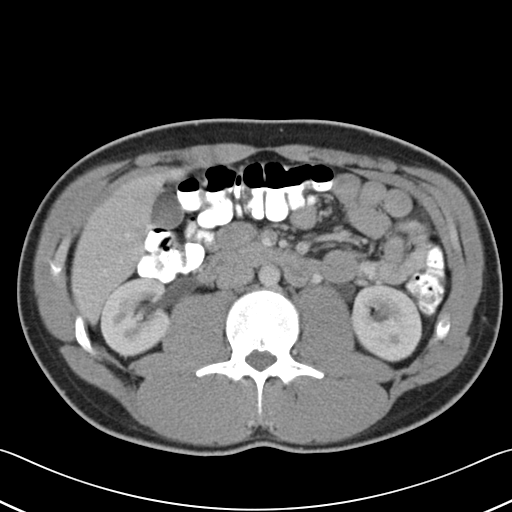
[im 60/90  bone]
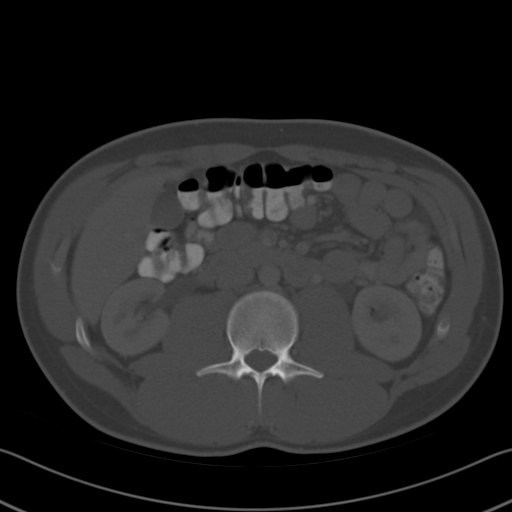
[im 64/90  soft-tissue]
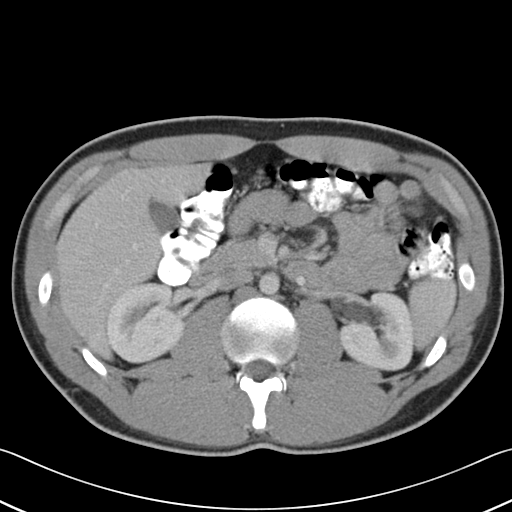
[im 71/90  soft-tissue]
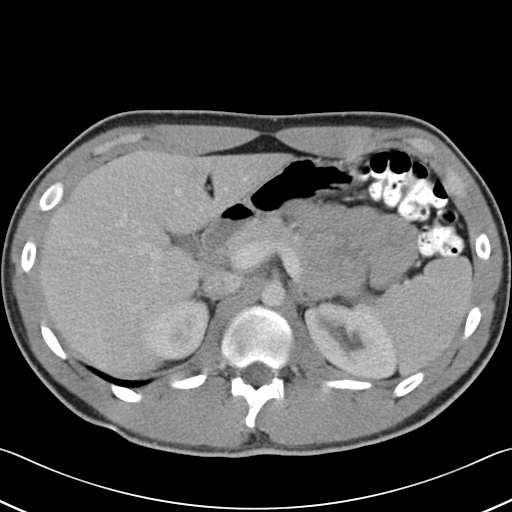
[im 78/90  soft-tissue]
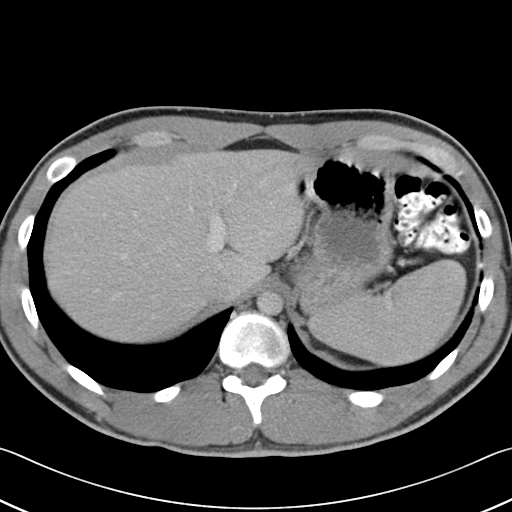
[im 86/90  soft-tissue]
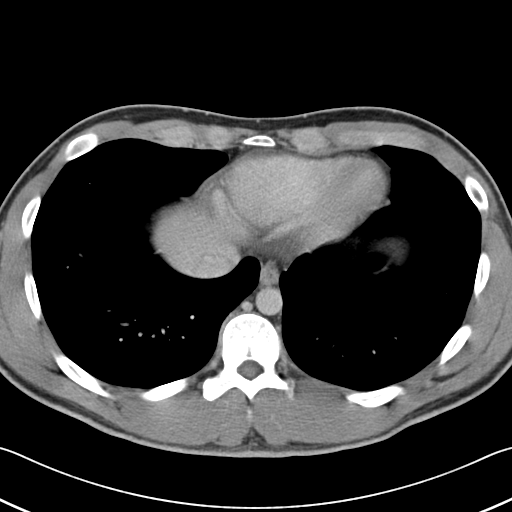

[Series 5: cor routine abd pel with · coronal · 0.65mm/px · 3 of 111 slices shown]
[im 37/111  soft-tissue]
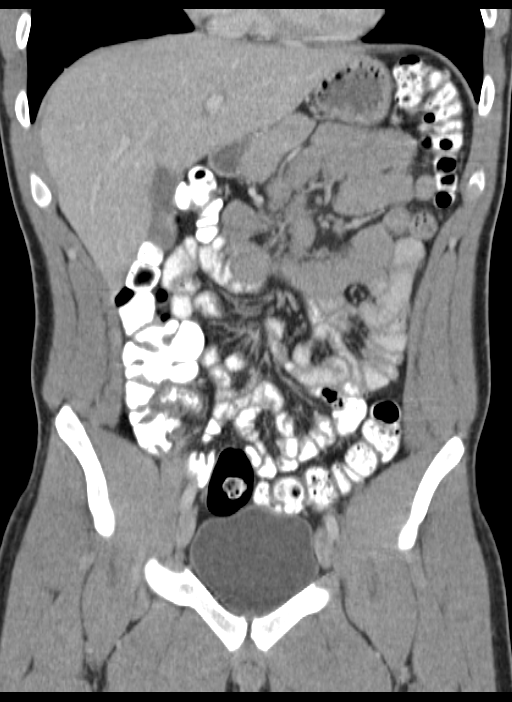
[im 49/111  soft-tissue]
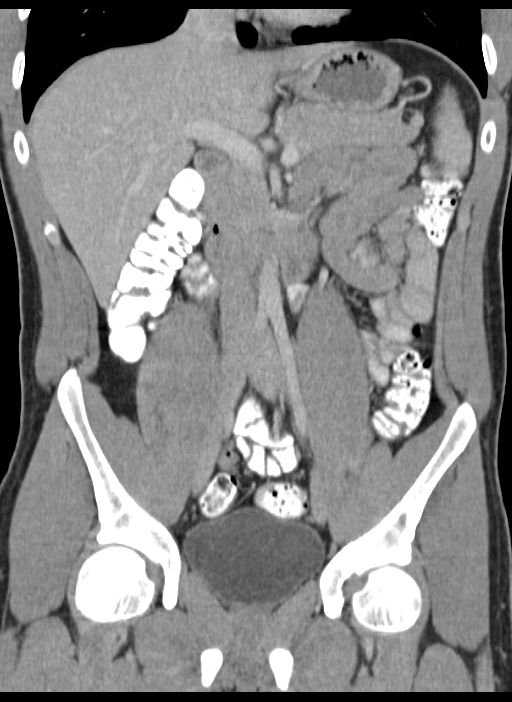
[im 62/111  soft-tissue]
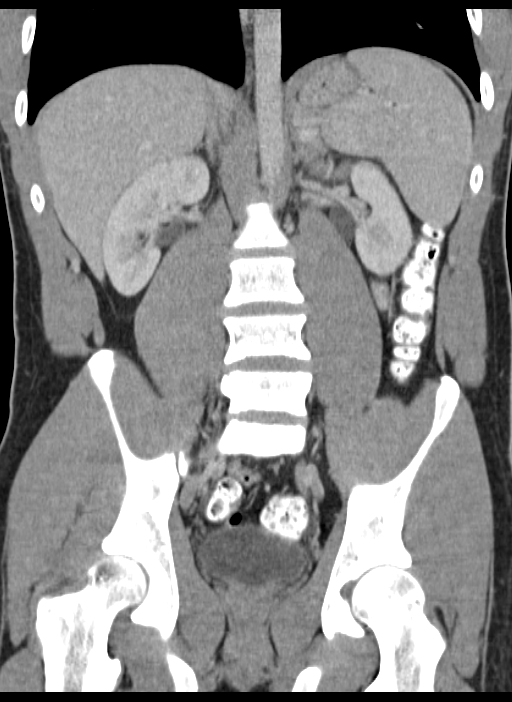

[16 of 46 positions shown; findings below may reference images not displayed]

FINDINGS: Lower chest: The lung bases are clear of acute process. No pleural
effusion or pulmonary lesions. The heart is normal in size. No
pericardial effusion. The distal esophagus and aorta are
unremarkable.

Hepatobiliary: Normal.

Pancreas: Normal.

Spleen: Normal.

Adrenals/Urinary Tract: Normal.

Stomach/Bowel: The stomach, duodenum, small bowel and colon are
unremarkable. No inflammatory changes, mass lesions or obstructive
findings. The terminal ileum is normal. The appendix is not
identified for certain but no findings for acute appendicitis.

Vascular/Lymphatic: No mesenteric or retroperitoneal mass or
adenopathy. The aorta and branch vessels are normal. The major
venous structures are patent.

Pelvis: The bladder, prostate gland and seminal vesicles are normal.
No pelvic mass, adenopathy or free pelvic fluid collections. No
inguinal mass or adenopathy.

Musculoskeletal: No significant bony findings. A unilateral pars
defect is noted on the right at L5.
IMPRESSION: No acute abdominal/ pelvic findings, mass lesions or adenopathy.

## 2015-12-09 ENCOUNTER — Telehealth: Payer: Self-pay | Admitting: Family Medicine

## 2015-12-09 NOTE — Telephone Encounter (Signed)
Pts mom called and would like to have immunization records showing that he has had his 2nd chicken pox vaccine.

## 2015-12-09 NOTE — Telephone Encounter (Signed)
Patient did not receive this vaccine in our office, we have no documentation of him receiving it from anywhere else

## 2016-02-29 ENCOUNTER — Telehealth: Payer: Self-pay | Admitting: Family Medicine

## 2016-02-29 NOTE — Telephone Encounter (Signed)
Patient mom called stating that if he can come in two have shots done and also blood work. 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 Mom also has other questions if Dr. Dossie Arbourrissman or CMA can call her back. Her number is [phone]  And ask for Oscar Garcia. Thanks.

## 2016-02-29 NOTE — Telephone Encounter (Signed)
Patient has not been seen since 2014 and MUST have an appointment for anything to be done

## 2016-03-20 ENCOUNTER — Ambulatory Visit: Payer: Self-pay | Admitting: Unknown Physician Specialty

## 2016-06-21 ENCOUNTER — Encounter: Payer: Self-pay | Admitting: Family Medicine

## 2016-10-10 ENCOUNTER — Ambulatory Visit: Payer: Self-pay | Admitting: Family Medicine

## 2016-10-10 ENCOUNTER — Encounter: Payer: Self-pay | Admitting: Family Medicine

## 2016-10-24 ENCOUNTER — Encounter: Payer: Self-pay | Admitting: Family Medicine

## 2016-11-28 ENCOUNTER — Encounter: Payer: Self-pay | Admitting: Family Medicine

## 2016-12-05 ENCOUNTER — Encounter: Payer: Self-pay | Admitting: Family Medicine

## 2016-12-05 ENCOUNTER — Ambulatory Visit (INDEPENDENT_AMBULATORY_CARE_PROVIDER_SITE_OTHER): Payer: Managed Care, Other (non HMO) | Admitting: Family Medicine

## 2016-12-05 VITALS — BP 117/73 | HR 87 | Temp 98.6°F | Wt 199.0 lb

## 2016-12-05 DIAGNOSIS — J111 Influenza due to unidentified influenza virus with other respiratory manifestations: Secondary | ICD-10-CM | POA: Diagnosis not present

## 2016-12-05 LAB — VERITOR FLU A/B WAIVED
INFLUENZA A: NEGATIVE
INFLUENZA B: NEGATIVE

## 2016-12-05 MED ORDER — OSELTAMIVIR PHOSPHATE 75 MG PO CAPS: 75.0000 mg | ORAL_CAPSULE | Freq: Two times a day (BID) | ORAL | 0 refills | Status: DC

## 2016-12-05 NOTE — Progress Notes (Signed)
BP 117/73   Pulse 87   Temp 98.6 F (37 C)   Wt 199 lb (90.3 kg)   SpO2 96%   BMI 26.25 kg/m    Subjective:    Patient ID: Oscar Garcia, male    DOB: 12/28/1995, 21 y.o.   MRN: [id number]  HPI: Oscar Garcia is a 21 y.o. male  Chief Complaint  Patient presents with  . URI    x 1 day. Fever, body aches, sore throat, head congestion, ears feel clogged. No chest congestion. No cough.   Sudden onset body aches, fatigue, weakness, congestion, sore throat, ear pressure x 1 day. Unsure if he's had a fever. No CP, SOB, whezing. Taking cold and flu medicine OTC with minimal relief. No known sick contacts, but is in school right now so lots of exposures.   History reviewed. No pertinent past medical history.  Social History   Social History  . Marital status: Single    Spouse name: N/A  . Number of children: N/A  . Years of education: N/A   Occupational History  . Not on file.   Social History Main Topics  . Smoking status: Never Smoker  . Smokeless tobacco: Never Used  . Alcohol use No  . Drug use: No  . Sexual activity: Not on file   Other Topics Concern  . Not on file   Social History Narrative  . No narrative on file    Relevant past medical, surgical, family and social history reviewed and updated as indicated. Interim medical history since our last visit reviewed. Allergies and medications reviewed and updated.  Review of Systems  Constitutional: Positive for fatigue.  HENT: Positive for congestion, ear pain and sore throat.   Eyes: Negative.   Respiratory: Negative.   Cardiovascular: Negative.   Gastrointestinal: Negative.   Genitourinary: Negative.   Musculoskeletal: Positive for myalgias.  Neurological: Positive for weakness.  Psychiatric/Behavioral: Negative.     Per HPI unless specifically indicated above     Objective:    BP 117/73   Pulse 87   Temp 98.6 F (37 C)   Wt 199 lb (90.3 kg)   SpO2 96%   BMI 26.25 kg/m   Wt Readings from  Last 3 Encounters:  12/05/16 199 lb (90.3 kg)  08/14/14 204 lb (92.5 kg)    Physical Exam  Constitutional: He is oriented to person, place, and time. He appears well-developed and well-nourished.  HENT:  Head: Atraumatic.  Right Ear: External ear normal.  Left Ear: External ear normal.  Nose: Nose normal.  Mouth/Throat: Oropharynx is clear and moist.  Eyes: Conjunctivae are normal. Pupils are equal, round, and reactive to light.  Neck: Normal range of motion. Neck supple.  Cardiovascular: Normal rate and normal heart sounds.   Pulmonary/Chest: Effort normal and breath sounds normal. No respiratory distress.  Musculoskeletal: Normal range of motion.  Neurological: He is alert and oriented to person, place, and time.  Skin: Skin is warm and dry.  Psychiatric: He has a normal mood and affect. His behavior is normal.  Nursing note and vitals reviewed.     Assessment & Plan:   Problem List Items Addressed This Visit    None    Visit Diagnoses    Influenza    -  Primary   Rapid flu neg, but symptoms consistent. Will start tamiflu, discussed supportive care. Continue OTC cold meds. F/u if worsening or no improvement.    Relevant Medications   oseltamivir (TAMIFLU) 75  MG capsule   Other Relevant Orders   Influenza A & B (STAT) (Completed)       Follow up plan: Return if symptoms worsen or fail to improve.

## 2016-12-05 NOTE — Patient Instructions (Signed)
Follow up as needed

## 2017-01-04 ENCOUNTER — Encounter: Payer: Self-pay | Admitting: Family Medicine

## 2017-01-04 ENCOUNTER — Ambulatory Visit (INDEPENDENT_AMBULATORY_CARE_PROVIDER_SITE_OTHER): Payer: Managed Care, Other (non HMO) | Admitting: Family Medicine

## 2017-01-04 ENCOUNTER — Other Ambulatory Visit: Payer: Self-pay | Admitting: Family Medicine

## 2017-01-04 VITALS — BP 124/81 | HR 74 | Ht 73.47 in | Wt 199.4 lb

## 2017-01-04 DIAGNOSIS — Z0000 Encounter for general adult medical examination without abnormal findings: Secondary | ICD-10-CM | POA: Diagnosis not present

## 2017-01-04 NOTE — Progress Notes (Signed)
   BP 124/81   Pulse 74   Ht 6' 1.46" (1.866 m)   Wt 199 lb 6.4 oz (90.4 kg)   SpO2 99%   BMI 25.98 kg/m    Subjective:    Patient ID: Oscar Garcia, male    DOB: 10-04-1995, 21 y.o.   MRN: [id number]  HPI: Oscar Garcia is a 21 y.o. male  Chief Complaint  Patient presents with  . Annual Exam  Work physical with paperwork filled out for vaccination record.  Relevant past medical, surgical, family and social history reviewed and updated as indicated. Interim medical history since our last visit reviewed. Allergies and medications reviewed and updated.  Review of Systems  Constitutional: Negative.   HENT: Negative.   Eyes: Negative.   Respiratory: Negative.   Cardiovascular: Negative.   Gastrointestinal: Negative.   Endocrine: Negative.   Genitourinary: Negative.   Musculoskeletal: Negative.   Skin: Negative.   Allergic/Immunologic: Negative.   Neurological: Negative.   Hematological: Negative.   Psychiatric/Behavioral: Negative.     Per HPI unless specifically indicated above     Objective:    BP 124/81   Pulse 74   Ht 6' 1.46" (1.866 m)   Wt 199 lb 6.4 oz (90.4 kg)   SpO2 99%   BMI 25.98 kg/m   Wt Readings from Last 3 Encounters:  01/04/17 199 lb 6.4 oz (90.4 kg)  12/05/16 199 lb (90.3 kg)  08/14/14 204 lb (92.5 kg)    Physical Exam  Constitutional: He is oriented to person, place, and time. He appears well-developed and well-nourished.  HENT:  Head: Normocephalic and atraumatic.  Eyes: Conjunctivae and EOM are normal.  Neck: Normal range of motion.  Cardiovascular: Normal rate, regular rhythm and normal heart sounds.   Pulmonary/Chest: Effort normal and breath sounds normal.  Musculoskeletal: Normal range of motion.  Neurological: He is alert and oriented to person, place, and time.  Skin: No erythema.  Psychiatric: He has a normal mood and affect. His behavior is normal. Judgment and thought content normal.    Results for orders placed or  performed in visit on 12/05/16  Influenza A & B (STAT)  Result Value Ref Range   Influenza A Negative Negative   Influenza B Negative Negative      Assessment & Plan:   Problem List Items Addressed This Visit    None    Visit Diagnoses    Annual physical exam    -  Primary   Relevant Orders   Quantiferon tb gold assay (blood)       Follow up plan: Return if symptoms worsen or fail to improve.

## 2017-01-04 NOTE — Addendum Note (Signed)
Addended byVonita Moss on: 01/04/2017 05:10 PM   Modules accepted: Orders

## 2017-01-07 LAB — QUANTIFERON IN TUBE
QFT TB AG MINUS NIL VALUE: 0.01 IU/mL
QUANTIFERON MITOGEN VALUE: 6.24 [IU]/mL
QUANTIFERON NIL VALUE: 0.02 [IU]/mL
QUANTIFERON TB AG VALUE: 0.03 IU/mL
QUANTIFERON TB GOLD: NEGATIVE

## 2017-01-07 LAB — QUANTIFERON TB GOLD ASSAY (BLOOD)

## 2017-01-08 ENCOUNTER — Encounter: Payer: Self-pay | Admitting: Family Medicine

## 2017-01-08 ENCOUNTER — Telehealth: Payer: Self-pay | Admitting: Family Medicine

## 2017-01-08 NOTE — Telephone Encounter (Signed)
Patient needs to speak with someone regarding the test he had last week. He has a deadline to get these in to [facility].   [phone]  Thanks

## 2017-01-08 NOTE — Telephone Encounter (Signed)
Spoke to patient and relayed that we are currently only waiting for drug screen to come back.

## 2017-01-09 LAB — DRUG SCREEN, 5 PANEL, UR
Amphetamines, Urine: NEGATIVE ng/mL
COCAINE (METAB.): NEGATIVE ng/mL
Cannabinoid Quant, Ur: NEGATIVE ng/mL
OPIATE QUANTITATIVE URINE: NEGATIVE ng/mL
PCP QUANT UR: NEGATIVE ng/mL

## 2017-01-09 LAB — SPECIMEN STATUS REPORT

## 2017-01-29 ENCOUNTER — Encounter: Payer: Self-pay | Admitting: Family Medicine

## 2017-02-01 ENCOUNTER — Encounter: Payer: Self-pay | Admitting: Family Medicine

## 2017-06-07 ENCOUNTER — Encounter: Payer: Self-pay | Admitting: Family Medicine

## 2017-06-07 ENCOUNTER — Ambulatory Visit (INDEPENDENT_AMBULATORY_CARE_PROVIDER_SITE_OTHER): Payer: Managed Care, Other (non HMO) | Admitting: Family Medicine

## 2017-06-07 VITALS — BP 104/64 | HR 84 | Temp 99.3°F | Wt 194.0 lb

## 2017-06-07 DIAGNOSIS — R509 Fever, unspecified: Secondary | ICD-10-CM

## 2017-06-07 NOTE — Progress Notes (Signed)
   BP 104/64   Pulse 84   Temp 99.3 F (37.4 C)   Wt 194 lb (88 kg)   SpO2 98%   BMI 25.27 kg/m    Subjective:    Patient ID: Oscar Garcia, male    DOB: 09/16/1996, 21 y.o.   MRN: [id number]  HPI: Oscar Garcia is a 21 y.o. male  Chief Complaint  Patient presents with  . Fever    woke up this morning with it, headache/pressure, achey. Some sinus drainage, dry cough. No head/chest congestion, no rash, no N/V/D.   Fever of 101 this morning, achy, HA, sinus pressure, body aches. Denies congestion, sore throat, N/V/D, CP, SOB. Taking mucinex and dayquil with minimal relief. Several sick contacts.   Relevant past medical, surgical, family and social history reviewed and updated as indicated. Interim medical history since our last visit reviewed. Allergies and medications reviewed and updated.  Review of Systems  Constitutional: Positive for chills and fever.  HENT: Positive for sinus pressure.   Eyes: Negative.   Respiratory: Negative.   Cardiovascular: Negative.   Gastrointestinal: Negative.   Genitourinary: Negative.   Musculoskeletal: Positive for myalgias.  Neurological: Positive for headaches.  Psychiatric/Behavioral: Negative.    Per HPI unless specifically indicated above     Objective:    BP 104/64   Pulse 84   Temp 99.3 F (37.4 C)   Wt 194 lb (88 kg)   SpO2 98%   BMI 25.27 kg/m   Wt Readings from Last 3 Encounters:  06/07/17 194 lb (88 kg)  01/04/17 199 lb 6.4 oz (90.4 kg)  12/05/16 199 lb (90.3 kg)    Physical Exam  Constitutional: He is oriented to person, place, and time. He appears well-developed and well-nourished. No distress.  HENT:  Head: Atraumatic.  Right Ear: External ear normal.  Left Ear: External ear normal.  Nose: Nose normal.  Mouth/Throat: Oropharynx is clear and moist. No oropharyngeal exudate.  Eyes: Pupils are equal, round, and reactive to light. Conjunctivae are normal.  Neck: Normal range of motion. Neck supple.    Cardiovascular: Normal rate and normal heart sounds.   Pulmonary/Chest: Effort normal and breath sounds normal. No respiratory distress.  Musculoskeletal: Normal range of motion.  Neurological: He is alert and oriented to person, place, and time.  Skin: Skin is warm and dry.  Psychiatric: He has a normal mood and affect. His behavior is normal.  Nursing note and vitals reviewed.     Assessment & Plan:   Problem List Items Addressed This Visit    None    Visit Diagnoses    Fever, unspecified fever cause    -  Primary   Suspect viral illness, supportive care reviewed. F/u if worsening or no improvement. Return precautions reviewed       Follow up plan: Return if symptoms worsen or fail to improve.

## 2017-06-10 NOTE — Patient Instructions (Signed)
Follow up if no improvement 

## 2017-11-06 ENCOUNTER — Ambulatory Visit: Payer: Managed Care, Other (non HMO) | Admitting: Family Medicine

## 2018-02-20 ENCOUNTER — Encounter: Payer: Managed Care, Other (non HMO) | Admitting: Family Medicine

## 2022-11-01 ENCOUNTER — Emergency Department
Admission: EM | Admit: 2022-11-01 | Discharge: 2022-11-01 | Disposition: A | Discharge status: Home / Self Care | Payer: PRIVATE HEALTH INSURANCE | Attending: Emergency Medicine | Admitting: Emergency Medicine

## 2022-11-01 ENCOUNTER — Other Ambulatory Visit: Payer: Self-pay

## 2022-11-01 ENCOUNTER — Emergency Department: Payer: PRIVATE HEALTH INSURANCE

## 2022-11-01 DIAGNOSIS — S022XXA Fracture of nasal bones, initial encounter for closed fracture: Secondary | ICD-10-CM | POA: Insufficient documentation

## 2022-11-01 DIAGNOSIS — S0121XA Laceration without foreign body of nose, initial encounter: Secondary | ICD-10-CM | POA: Insufficient documentation

## 2022-11-01 DIAGNOSIS — Y9367 Activity, basketball: Secondary | ICD-10-CM | POA: Insufficient documentation

## 2022-11-01 DIAGNOSIS — W2105XA Struck by basketball, initial encounter: Secondary | ICD-10-CM | POA: Diagnosis not present

## 2022-11-01 DIAGNOSIS — S0992XA Unspecified injury of nose, initial encounter: Secondary | ICD-10-CM | POA: Diagnosis present

## 2022-11-01 DIAGNOSIS — R040 Epistaxis: Secondary | ICD-10-CM

## 2022-11-01 MED ORDER — OXYMETAZOLINE HCL 0.05 % NA SOLN
1.0000 | Freq: Once | NASAL | Status: AC
  Administered 2022-11-01: 1 via NASAL
  Filled 2022-11-01: qty 30

## 2022-11-01 MED ORDER — LIDOCAINE-EPINEPHRINE-TETRACAINE (LET) TOPICAL GEL
3.0000 mL | Freq: Once | TOPICAL | Status: DC
  Filled 2022-11-01: qty 3

## 2022-11-01 MED ORDER — TETANUS-DIPHTH-ACELL PERTUSSIS 5-2.5-18.5 LF-MCG/0.5 IM SUSY
0.5000 mL | PREFILLED_SYRINGE | Freq: Once | INTRAMUSCULAR | Status: DC
  Filled 2022-11-01 (×2): qty 0.5

## 2022-11-01 MED ORDER — AMOXICILLIN-POT CLAVULANATE 875-125 MG PO TABS: 1.0000 | ORAL_TABLET | Freq: Two times a day (BID) | ORAL | 0 refills | Status: AC

## 2022-11-01 NOTE — ED Provider Notes (Signed)
[facility] Provider Note    Event Date/Time   First MD Initiated Contact with Patient 11/01/22 [phone]     (approximate)   History   Epistaxis   HPI  Oscar Garcia is a 27 y.o. male who is otherwise healthy was play basketball this morning when he was hit by another player's head into the nose and felt a crack.  He is an abrasion to the top of the nose and he was worried about a nasal fracture.  He denies any LOC denies any blood thinner usage.  Denies any neck pain.  He denies any other injuries.  Unclear of last tetanus shot.  He reports some slight bleeding from the nose itself but most of the bleeding is noted from the abrasion on top of the nose.   Physical Exam   Triage Vital Signs: ED Triage Vitals  Enc Vitals Group     BP 11/01/22 0723 (!) 145/67     Pulse Rate 11/01/22 0715 (!) 107     Resp 11/01/22 0715 18     Temp 11/01/22 0715 98 F (36.7 C)     Temp src --      SpO2 11/01/22 0715 99 %     Weight --      Height --      Head Circumference --      Peak Flow --      Pain Score 11/01/22 0714 5     Pain Loc --      Pain Edu? --      Excl. in [place]? --     Most recent vital signs: Vitals:   11/01/22 0715 11/01/22 0723  BP:  (!) 145/67  Pulse: (!) 107   Resp: 18   Temp: 98 F (36.7 C)   SpO2: 99%      General: Awake, no distress.  CV:  Good peripheral perfusion.  Resp:  Normal effort.  Abd:  No distention.  Other:  Patient has small abrasion to the top of the nose. About 1cm does not prob very deep. No septal hematoma.  No c spine tenderness, full ROM of neck.  No chest wall, abdominal tenderness.  No tenderness in the arms or legs. Dried blood noted in the bilateral nares  ED Results / Procedures / Treatments   Labs (all labs ordered are listed, but only abnormal results are displayed) Labs Reviewed - No data to display    RADIOLOGY I have reviewed the CT personally and interpreted and no evidence of intracranial  hemorrhage  PROCEDURES:  Critical Care performed: No  ..Laceration Repair  Date/Time: 11/01/2022 8:13 AM  Performed by: Vanessa [place], MD Authorized by: Vanessa [place], MD   Consent:    Consent obtained:  Verbal   Consent given by:  Patient   Risks discussed:  Infection, pain, retained foreign body, need for additional repair, poor cosmetic result, vascular damage, poor wound healing and nerve damage   Alternatives discussed:  No treatment Universal protocol:    Patient identity confirmed:  Verbally with patient Anesthesia:    Anesthesia method:  None Laceration details:    Location:  Face   Face location:  Nose   Length (cm):  1   Depth (mm):  1 Treatment:    Irrigation solution:  Sterile saline   Irrigation volume:  30cc   Irrigation method:  Syringe   Visualized foreign bodies/material removed: no     Debridement:  None   Undermining:  None   Scar revision: no   Skin repair:    Repair method:  Tissue adhesive Approximation:    Approximation:  Close Repair type:    Repair type:  Simple Post-procedure details:    Dressing:  Open (no dressing)   Procedure completion:  Tolerated well, no immediate complications    MEDICATIONS ORDERED IN ED: Medications  Tdap (BOOSTRIX) injection 0.5 mL (0.5 mLs Intramuscular Patient Refused/Not Given 11/01/22 0744)  oxymetazoline (AFRIN) 0.05 % nasal spray 1 spray (1 spray Each Nare Given 11/01/22 0744)     IMPRESSION / MDM / ASSESSMENT AND PLAN / ED COURSE  I reviewed the triage vital signs and the nursing notes.   Patient's presentation is most consistent with acute presentation with potential threat to life or bodily function.   Differentials nasal fracture, facial fracture, intracranial hemorrhage.  No C-spine tenderness full range of motion of neck.  No numbness or tingling down the arms or legs.  Low suspicion for cervical injury.  I reviewed patient's labs from 2019 where patient had normal white count, normal hemoglobin,  normal platelets.  He also had normal liver test and creatinine.  I reviewed patient's office visit from 2020 where he was seen for umbilical hernia.  Will update patient's tetanus.  No active bleeding at this time.  Patient given Afrin and nasal clip in case he does have return of nosebleeding.  He was instructed on how to manage this at home and return precautions if bleeding is not stopping.  We discussed different options for repair of laceration on top of the nose.  Bleeding was able to subside with pressure.  We discussed sutures versus glue.  Patient preferred glue which I think is reasonable given linear laceration and no longer active bleeding  IMPRESSION: Nasal arch and anterior septum fractures with right-sided depression. Associated soft tissue injury with soft tissue gas.  Discussed the case with ENT Dr. Richardson Landry given concern for laceration above the nose with soft tissue gas but will cover with antibiotics in case this could be an open fracture but can f/u outpt for ENT evaluation to see if needs surgery once swelling is down.   Reevaluated patient has had no nasal bleeding for over an hour and feels comfortable with discharge home.     FINAL CLINICAL IMPRESSION(S) / ED DIAGNOSES   Final diagnoses:  Laceration of nose, initial encounter  Closed fracture of nasal bone, initial encounter  Epistaxis     Rx / DC Orders   ED Discharge Orders          Ordered    amoxicillin-clavulanate (AUGMENTIN) 875-125 MG tablet  2 times daily        11/01/22 0815             Note:  This document was prepared using Dragon voice recognition software and may include unintentional dictation errors.   Vanessa [place], MD 11/01/22 [phone]

## 2022-11-01 NOTE — Discharge Instructions (Addendum)
Take the antibiotics to prevent infection.  Call ENT to make a follow-up appointment to decide if you will need surgery.  Tylenol 1 g every 8 hours.  You can take ibuprofen 600 every 6-8 hours with food.   IMPRESSION: Nasal arch and anterior septum fractures with right-sided depression. Associated soft tissue injury with soft tissue gas.  No Nose blowing.

## 2022-11-01 NOTE — ED Notes (Signed)
Patient declined to take the Tdap injection.

## 2022-11-01 NOTE — ED Triage Notes (Signed)
Pt comes with c/o broken nose. Pt was playing basketball and another player's head hit his nose. Pt has some bleeding noted and abrasion to top.

## 2022-11-06 ENCOUNTER — Encounter
Admission: RE | Admit: 2022-11-06 | Discharge: 2022-11-06 | Disposition: A | Discharge status: Home / Self Care | Payer: Self-pay | Source: Ambulatory Visit | Attending: Otolaryngology | Admitting: Otolaryngology

## 2022-11-06 ENCOUNTER — Other Ambulatory Visit: Payer: Self-pay

## 2022-11-06 NOTE — Patient Instructions (Addendum)
Your procedure is scheduled on: 11/09/22 Report to [place]. To find out your arrival time please call [phone] between 1PM - 3PM on 11/08/22 .  Remember: Instructions that are not followed completely may result in serious medical risk, up to and including death, or upon the discretion of your surgeon and anesthesiologist your surgery may need to be rescheduled.     _X__ 1. Do not eat food after midnight the night before your procedure.                 No gum chewing or hard candies. You may drink clear liquids up to 2 hours                 before you are scheduled to arrive for your surgery- DO not drink clear                 liquids within 2 hours of the start of your surgery.                 Clear Liquids include:  water, apple juice without pulp, clear carbohydrate                 drink such as Clearfast or Gatorade, Black Coffee or Tea (Do not add                 anything to coffee or tea). Diabetics water only  ______    Drink the Ensure "clear" pre surgery drink 2 hours before arriving for surgery  ______    Drink the G2 Gatorade drink 2 hours before arriving for surgery   __X__2.  On the morning of surgery brush your teeth with toothpaste and water, you                 may rinse your mouth with mouthwash if you wish.  Do not swallow any              toothpaste of mouthwash.     _X__ 3.  No Alcohol for 24 hours before or after surgery.   _X__ 4.  Do Not Smoke or use e-cigarettes For 24 Hours Prior to Your Surgery.                 Do not use any chewable tobacco products for at least 6 hours prior to                 surgery.  ____  5.  Bring all medications with you on the day of surgery if instructed.   __X__  6.  Notify your doctor if there is any change in your medical condition      (cold, fever, infections).     Do not wear jewelry, make-up, hairpins, clips or nail polish. Do not wear lotions, powders, or perfumes.  You may use deodorant. Do not shave body hair 48 hours prior to surgery. Men may shave face and neck. Do not bring valuables to the hospital.    [facility] is not responsible for any belongings or valuables.  Contacts, dentures/partials or body piercings may not be worn into surgery. Bring a case for your contacts, glasses or hearing aids, a denture cup will be supplied. Leave your suitcase or overnight bag in the car. After surgery it may be brought to your room. For patients admitted to the hospital, discharge time is determined by your treatment team. In case of increased  patient census, it may be necessary for you, the patient, to continue your postoperative care within the Same Day Surgery department.   Patients discharged the day of surgery will need a responsible, legally licensed driver to drive them home. A learners permit is NOT acceptable. The driver must be free of impairment including anesthesia medications You will not be allowed to drive yourself home. If you are going home via [place], [place], Taxi or other public transportation (non medical) you MUST be accompanied be a responsible individual.   __X__ Take these medicines the morning of surgery with A SIP OF WATER:    1. none  2.   3.   ____ Fleet Enema (as directed)   ____ Use CHG Soap/SAGE wipes as directed  ____ Use inhalers on the day of surgery  ____ Stop metformin/Janumet/Farxiga 2 days prior to surgery    ____ Take 1/2 of usual insulin dose the night before surgery. No insulin the morning          of surgery.   ____ Stop Blood Thinners Coumadin/Plavix/Xarelto/Pleta/Pradaxa/Eliquis/Effient/Aspirin  on   Or contact your Surgeon, Cardiologist or Medical Doctor regarding  ability to stop your blood thinners  __X__ Stop Anti-inflammatories 7 days before surgery such as Advil, Ibuprofen, Motrin,  BC or Goodies Powder, Naprosyn, Naproxen, Aleve, Aspirin   You may substitute Tylenol   __X__ Stop all herbals and  supplements, fish oil or vitamins 7 days before surgery.      ____ Bring C-Pap to the hospital.

## 2022-11-09 ENCOUNTER — Encounter: Payer: Self-pay | Admitting: Otolaryngology

## 2022-11-09 ENCOUNTER — Other Ambulatory Visit: Payer: Self-pay

## 2022-11-09 ENCOUNTER — Ambulatory Visit: Payer: Self-pay | Admitting: Certified Registered Nurse Anesthetist

## 2022-11-09 ENCOUNTER — Encounter
Admission: RE | Disposition: A | Discharge status: Home / Self Care | Payer: Self-pay | Source: Home / Self Care | Attending: Otolaryngology

## 2022-11-09 ENCOUNTER — Ambulatory Visit
Admission: RE | Admit: 2022-11-09 | Discharge: 2022-11-09 | Disposition: A | Discharge status: Home / Self Care | Payer: Self-pay | Attending: Otolaryngology | Admitting: Otolaryngology

## 2022-11-09 ENCOUNTER — Ambulatory Visit: Payer: Self-pay | Admitting: Urgent Care

## 2022-11-09 DIAGNOSIS — Y9367 Activity, basketball: Secondary | ICD-10-CM | POA: Insufficient documentation

## 2022-11-09 DIAGNOSIS — S022XXA Fracture of nasal bones, initial encounter for closed fracture: Secondary | ICD-10-CM | POA: Insufficient documentation

## 2022-11-09 HISTORY — PX: CLOSED REDUCTION NASAL FRACTURE: SHX5365

## 2022-11-09 SURGERY — CLOSED REDUCTION, FRACTURE, NASAL BONE
Anesthesia: General

## 2022-11-09 MED ORDER — CHLORHEXIDINE GLUCONATE 0.12 % MT SOLN
OROMUCOSAL | Status: AC
  Filled 2022-11-09: qty 15

## 2022-11-09 MED ORDER — ONDANSETRON HCL 4 MG/2ML IJ SOLN
INTRAMUSCULAR | Status: DC | PRN
  Administered 2022-11-09: 4 mg via INTRAVENOUS

## 2022-11-09 MED ORDER — OXYCODONE HCL 5 MG/5ML PO SOLN: 5.0000 mg | Freq: Once | ORAL | Status: AC | PRN

## 2022-11-09 MED ORDER — FAMOTIDINE 20 MG PO TABS
ORAL_TABLET | ORAL | Status: AC
  Filled 2022-11-09: qty 1

## 2022-11-09 MED ORDER — PHENYLEPHRINE HCL (PRESSORS) 10 MG/ML IV SOLN
INTRAVENOUS | Status: AC
  Filled 2022-11-09: qty 1

## 2022-11-09 MED ORDER — FENTANYL CITRATE (PF) 100 MCG/2ML IJ SOLN
INTRAMUSCULAR | Status: DC | PRN
  Administered 2022-11-09 (×2): 50 ug via INTRAVENOUS

## 2022-11-09 MED ORDER — PHENYLEPHRINE HCL 10 % OP SOLN
OPHTHALMIC | Status: DC | PRN
  Administered 2022-11-09: 20 mL via TOPICAL

## 2022-11-09 MED ORDER — ONDANSETRON HCL 4 MG/2ML IJ SOLN: 4.0000 mg | Freq: Once | INTRAMUSCULAR | Status: DC | PRN

## 2022-11-09 MED ORDER — ACETAMINOPHEN 10 MG/ML IV SOLN
INTRAVENOUS | Status: AC
  Filled 2022-11-09: qty 100

## 2022-11-09 MED ORDER — OXYCODONE HCL 5 MG PO TABS: 5.0000 mg | ORAL_TABLET | Freq: Once | ORAL | Status: AC | PRN

## 2022-11-09 MED ORDER — PROPOFOL 10 MG/ML IV BOLUS
INTRAVENOUS | Status: AC
  Filled 2022-11-09: qty 20

## 2022-11-09 MED ORDER — PROPOFOL 10 MG/ML IV BOLUS
INTRAVENOUS | Status: DC | PRN
  Administered 2022-11-09: 200 mg via INTRAVENOUS

## 2022-11-09 MED ORDER — LACTATED RINGERS IV SOLN: INTRAVENOUS | Status: DC

## 2022-11-09 MED ORDER — CHLORHEXIDINE GLUCONATE 0.12 % MT SOLN
15.0000 mL | Freq: Once | OROMUCOSAL | Status: AC
  Administered 2022-11-09: 15 mL via OROMUCOSAL

## 2022-11-09 MED ORDER — LIDOCAINE HCL (CARDIAC) PF 100 MG/5ML IV SOSY
PREFILLED_SYRINGE | INTRAVENOUS | Status: DC | PRN
  Administered 2022-11-09: 100 mg via INTRAVENOUS

## 2022-11-09 MED ORDER — MIDAZOLAM HCL 2 MG/2ML IJ SOLN
INTRAMUSCULAR | Status: AC
  Filled 2022-11-09: qty 2

## 2022-11-09 MED ORDER — ORAL CARE MOUTH RINSE: 15.0000 mL | Freq: Once | OROMUCOSAL | Status: AC

## 2022-11-09 MED ORDER — MIDAZOLAM HCL 2 MG/2ML IJ SOLN
INTRAMUSCULAR | Status: DC | PRN
  Administered 2022-11-09: 2 mg via INTRAVENOUS

## 2022-11-09 MED ORDER — ACETAMINOPHEN 10 MG/ML IV SOLN: 1000.0000 mg | Freq: Once | INTRAVENOUS | Status: DC | PRN

## 2022-11-09 MED ORDER — FAMOTIDINE 20 MG PO TABS
20.0000 mg | ORAL_TABLET | Freq: Once | ORAL | Status: AC
  Administered 2022-11-09: 20 mg via ORAL

## 2022-11-09 MED ORDER — FENTANYL CITRATE (PF) 100 MCG/2ML IJ SOLN
INTRAMUSCULAR | Status: AC
  Filled 2022-11-09: qty 2

## 2022-11-09 MED ORDER — FENTANYL CITRATE (PF) 100 MCG/2ML IJ SOLN: 25.0000 ug | INTRAMUSCULAR | Status: DC | PRN

## 2022-11-09 MED ORDER — DEXAMETHASONE SODIUM PHOSPHATE 10 MG/ML IJ SOLN
INTRAMUSCULAR | Status: DC | PRN
  Administered 2022-11-09: 10 mg via INTRAVENOUS

## 2022-11-09 MED ORDER — OXYCODONE HCL 5 MG PO TABS
ORAL_TABLET | ORAL | Status: AC
  Administered 2022-11-09: 5 mg via ORAL
  Filled 2022-11-09: qty 1

## 2022-11-09 MED ORDER — LIDOCAINE HCL (PF) 4 % IJ SOLN
INTRAMUSCULAR | Status: AC
  Filled 2022-11-09: qty 20

## 2022-11-09 MED ORDER — 0.9 % SODIUM CHLORIDE (POUR BTL) OPTIME
TOPICAL | Status: DC | PRN
  Administered 2022-11-09: 500 mL

## 2022-11-09 SURGICAL SUPPLY — 13 items
AQUAPLAST 3X3 FLAT (MISCELLANEOUS) ×1
GAUZE 4X4 16PLY ~~LOC~~+RFID DBL (SPONGE) ×1 IMPLANT
GLOVE PROTEXIS LATEX SZ 7.5 (GLOVE) ×1 IMPLANT
GLOVE SURG LATEX 7.5 PF (GLOVE) ×1 IMPLANT
GOWN STRL REUS W/ TWL LRG LVL3 (GOWN DISPOSABLE) ×2 IMPLANT
GOWN STRL REUS W/TWL LRG LVL3 (GOWN DISPOSABLE) ×2
MANIFOLD NEPTUNE II (INSTRUMENTS) ×1 IMPLANT
PACK HEAD/NECK (MISCELLANEOUS) ×1 IMPLANT
SPLINT AQUAPLAST 3X3 FLAT (MISCELLANEOUS) IMPLANT
SPONGE NEURO XRAY DETECT 1X3 (DISPOSABLE) ×1 IMPLANT
STRIP CLOSURE SKIN 1/2X4 (GAUZE/BANDAGES/DRESSINGS) ×1 IMPLANT
TRAP FLUID SMOKE EVACUATOR (MISCELLANEOUS) ×1 IMPLANT
WATER STERILE IRR 500ML POUR (IV SOLUTION) ×1 IMPLANT

## 2022-11-09 NOTE — Op Note (Signed)
11/09/2022  8:01 AM    Oscar Garcia  [id number]   Pre-Op Dx: Displaced nasal fracture  Post-op Dx: Same  Proc: Closed reduction nasal fracture  Surg:  Huey Romans  Anes:  LMA  EBL: None  Comp: None  Findings: His x-rays showed multiple bone fractures distally with depression of the nasal bones.  Procedure: Patient was brought to the operating room and placed in the supine position.  He was given general anesthesia by LMA.  Once patient was asleep his nose was visualized and on the right nasal bones were collapsed inward.  Cottonoid pledgets were placed into the nose that were soaked with Afrin and lidocaine.  These were allowed to sit for 5 minutes.  His nose prepped with alcohol sponges.  He had a small laceration over the nasal bridge with Dermabond over the laceration to hold it.  He was prepped and draped in sterile fashion.  The cotton pledgets were removed and the nose was revisualize.  There is no bleeding there.  Ashe nasal bone forceps were placed into the nose and along the septum and lifted upward.  This elevated the nasal bone on both sides and pushed the right nasal bones back into their normal position.  Once I lifted up those bones felt like they were securely in position and did not collapse.  I checked this several times to make sure that they would not collapse inward. They seemed solidly in place.   Steristrips were applied to the nasal dorsum followed by an aquaplast cast. Tape was then placed over this.  The patient tolerated the procedure well. He was awakened and taken to the recovery room in satisfactory condition. There were no complications. .  Dispo:   To PACU to be discharged home   Plan:  Will see in office in 1 week for cast removal. Will rest at home.  [name]  11/09/2022 8:01 AM

## 2022-11-09 NOTE — Transfer of Care (Signed)
Immediate Anesthesia Transfer of Care Note  Patient: Oscar Garcia  Procedure(s) Performed: CLOSED REDUCTION NASAL FRACTURE WITH STABILIZATION  Patient Location: PACU  Anesthesia Type:General  Level of Consciousness: drowsy  Airway & Oxygen Therapy: Patient Spontanous Breathing and Patient connected to nasal cannula oxygen  Post-op Assessment: Report given to RN and Post -op Vital signs reviewed and stable  Post vital signs: Reviewed and stable  Last Vitals:  Vitals Value Taken Time  BP 110/57 11/09/22 0809  Temp    Pulse 59 11/09/22 0810  Resp 12 11/09/22 0810  SpO2 100 % 11/09/22 0810  Vitals shown include unvalidated device data.  Last Pain:  Vitals:   11/09/22 0614  TempSrc: Tympanic  PainSc: 0-No pain      Patients Stated Pain Goal: 0 (47/42/59 [id number])  Complications: No notable events documented.

## 2022-11-09 NOTE — Anesthesia Preprocedure Evaluation (Signed)
Anesthesia Evaluation  Patient identified by MRN, date of birth, ID band Patient awake    Reviewed: Allergy & Precautions, NPO status , Patient's Chart, lab work & pertinent test results  History of Anesthesia Complications Negative for: history of anesthetic complications  Airway Mallampati: II  TM Distance: >3 FB Neck ROM: Full    Dental no notable dental hx. (+) Teeth Intact   Pulmonary neg pulmonary ROS, neg sleep apnea, neg COPD, Patient abstained from smoking.Not current smoker   Pulmonary exam normal breath sounds clear to auscultation       Cardiovascular Exercise Tolerance: Good METS(-) hypertension(-) CAD and (-) Past MI negative cardio ROS (-) dysrhythmias  Rhythm:Regular Rate:Normal - Systolic murmurs    Neuro/Psych negative neurological ROS  negative psych ROS   GI/Hepatic ,neg GERD  ,,(+)     (-) substance abuse    Endo/Other  neg diabetes    Renal/GU negative Renal ROS     Musculoskeletal   Abdominal   Peds  Hematology   Anesthesia Other Findings History reviewed. No pertinent past medical history.  Reproductive/Obstetrics                             Anesthesia Physical Anesthesia Plan  ASA: 1  Anesthesia Plan: General   Post-op Pain Management: Ofirmev IV (intra-op)*   Induction: Intravenous  PONV Risk Score and Plan: 3 and Ondansetron, Dexamethasone and Midazolam  Airway Management Planned: LMA  Additional Equipment: None  Intra-op Plan:   Post-operative Plan: Extubation in OR  Informed Consent: I have reviewed the patients History and Physical, chart, labs and discussed the procedure including the risks, benefits and alternatives for the proposed anesthesia with the patient or authorized representative who has indicated his/her understanding and acceptance.     Dental advisory given  Plan Discussed with: CRNA and Surgeon  Anesthesia Plan Comments:  (Discussed risks of anesthesia with patient, including PONV, sore throat, lip/dental/eye damage. Rare risks discussed as well, such as cardiorespiratory and neurological sequelae, and allergic reactions. Discussed the role of CRNA in patient's perioperative care. Patient understands.)       Anesthesia Quick Evaluation

## 2022-11-09 NOTE — Anesthesia Postprocedure Evaluation (Signed)
Anesthesia Post Note  Patient: DEMPSY DAMIANO  Procedure(s) Performed: CLOSED REDUCTION NASAL FRACTURE WITH STABILIZATION  Patient location during evaluation: PACU Anesthesia Type: General Level of consciousness: awake and alert Pain management: pain level controlled Vital Signs Assessment: post-procedure vital signs reviewed and stable Respiratory status: spontaneous breathing, nonlabored ventilation, respiratory function stable and patient connected to nasal cannula oxygen Cardiovascular status: blood pressure returned to baseline and stable Postop Assessment: no apparent nausea or vomiting Anesthetic complications: no   No notable events documented.   Last Vitals:  Vitals:   11/09/22 0811 11/09/22 0815  BP: (!) 110/57 (!) 103/53  Pulse: (!) 59 63  Resp: 12 10  Temp: (!) 36.1 C   SpO2: 100% 92%    Last Pain:  Vitals:   11/09/22 0815  TempSrc:   PainSc: 0-No pain                 Arita Miss

## 2022-11-09 NOTE — Anesthesia Procedure Notes (Signed)
Procedure Name: LMA Insertion Date/Time: 11/09/2022 7:32 AM  Performed by: Lily Peer, Rueben Kassim, CRNAPre-anesthesia Checklist: Patient identified, Emergency Drugs available, Suction available and Patient being monitored Patient Re-evaluated:Patient Re-evaluated prior to induction Oxygen Delivery Method: Circle system utilized Preoxygenation: Pre-oxygenation with 100% oxygen Induction Type: IV induction Ventilation: Mask ventilation without difficulty LMA: LMA inserted LMA Size: 4.0 Number of attempts: 1 Placement Confirmation: positive ETCO2 and breath sounds checked- equal and bilateral Tube secured with: Tape Dental Injury: Teeth and Oropharynx as per pre-operative assessment

## 2022-11-09 NOTE — Discharge Instructions (Signed)

## 2022-11-09 NOTE — H&P (Signed)
H&P has been reviewed and patient reevaluated, no changes necessary. To be downloaded later.  

## 2022-11-10 ENCOUNTER — Encounter: Payer: Self-pay | Admitting: Otolaryngology
